# Patient Record
Sex: Male | Born: 1950
Health system: Southern US, Community
[De-identification: ages and names within clinical notes are randomized; demographics above are authoritative.]

## PROBLEM LIST (undated history)

## (undated) DIAGNOSIS — R112 Nausea with vomiting, unspecified: Secondary | ICD-10-CM

## (undated) DIAGNOSIS — F419 Anxiety disorder, unspecified: Secondary | ICD-10-CM

## (undated) DIAGNOSIS — G8929 Other chronic pain: Secondary | ICD-10-CM

## (undated) DIAGNOSIS — C801 Malignant (primary) neoplasm, unspecified: Secondary | ICD-10-CM

## (undated) DIAGNOSIS — M542 Cervicalgia: Secondary | ICD-10-CM

## (undated) DIAGNOSIS — Z87898 Personal history of other specified conditions: Secondary | ICD-10-CM

## (undated) DIAGNOSIS — Z9889 Other specified postprocedural states: Secondary | ICD-10-CM

## (undated) DIAGNOSIS — K219 Gastro-esophageal reflux disease without esophagitis: Secondary | ICD-10-CM

## (undated) DIAGNOSIS — R002 Palpitations: Secondary | ICD-10-CM

## (undated) DIAGNOSIS — M1990 Unspecified osteoarthritis, unspecified site: Secondary | ICD-10-CM

## (undated) DIAGNOSIS — I10 Essential (primary) hypertension: Secondary | ICD-10-CM

## (undated) DIAGNOSIS — R0981 Nasal congestion: Secondary | ICD-10-CM

## (undated) DIAGNOSIS — N400 Enlarged prostate without lower urinary tract symptoms: Secondary | ICD-10-CM

## (undated) DIAGNOSIS — G479 Sleep disorder, unspecified: Secondary | ICD-10-CM

## (undated) HISTORY — DX: Essential (primary) hypertension: I10

## (undated) HISTORY — PX: CERVICAL FUSION: SHX112

## (undated) HISTORY — PX: TONSILLECTOMY: SUR1361

## (undated) HISTORY — DX: Malignant (primary) neoplasm, unspecified: C80.1

## (undated) HISTORY — PX: HERNIA REPAIR: SHX51

## (undated) HISTORY — DX: Unspecified osteoarthritis, unspecified site: M19.90

## (undated) HISTORY — DX: Nasal congestion: R09.81

## (undated) HISTORY — DX: Other chronic pain: G89.29

## (undated) HISTORY — DX: Cervicalgia: M54.2

---

## 1996-06-06 HISTORY — PX: SHOULDER SURGERY: SHX246

## 2006-11-13 ENCOUNTER — Ambulatory Visit: Payer: Self-pay | Admitting: Orthopedic Surgery

## 2006-11-27 ENCOUNTER — Ambulatory Visit: Payer: Self-pay | Admitting: Orthopedic Surgery

## 2007-01-04 ENCOUNTER — Ambulatory Visit: Payer: Self-pay | Admitting: Orthopedic Surgery

## 2007-01-08 ENCOUNTER — Ambulatory Visit (HOSPITAL_COMMUNITY): Admission: RE | Admit: 2007-01-08 | Discharge: 2007-01-08 | Payer: Self-pay | Admitting: Orthopedic Surgery

## 2007-01-18 ENCOUNTER — Ambulatory Visit: Payer: Self-pay | Admitting: Orthopedic Surgery

## 2007-01-25 ENCOUNTER — Encounter (HOSPITAL_COMMUNITY): Admission: RE | Admit: 2007-01-25 | Discharge: 2007-02-24 | Payer: Self-pay | Admitting: Orthopedic Surgery

## 2007-02-22 ENCOUNTER — Encounter (INDEPENDENT_AMBULATORY_CARE_PROVIDER_SITE_OTHER): Payer: Self-pay | Admitting: *Deleted

## 2007-02-22 ENCOUNTER — Ambulatory Visit: Payer: Self-pay | Admitting: Orthopedic Surgery

## 2007-02-22 DIAGNOSIS — IMO0002 Reserved for concepts with insufficient information to code with codable children: Secondary | ICD-10-CM | POA: Insufficient documentation

## 2007-02-22 DIAGNOSIS — M1710 Unilateral primary osteoarthritis, unspecified knee: Secondary | ICD-10-CM

## 2007-03-06 ENCOUNTER — Ambulatory Visit (HOSPITAL_COMMUNITY): Admission: RE | Admit: 2007-03-06 | Discharge: 2007-03-06 | Payer: Self-pay | Admitting: Orthopedic Surgery

## 2007-03-06 ENCOUNTER — Ambulatory Visit: Payer: Self-pay | Admitting: Orthopedic Surgery

## 2007-03-08 ENCOUNTER — Ambulatory Visit: Payer: Self-pay | Admitting: Orthopedic Surgery

## 2007-03-09 ENCOUNTER — Encounter (HOSPITAL_COMMUNITY): Admission: RE | Admit: 2007-03-09 | Discharge: 2007-04-08 | Payer: Self-pay | Admitting: Orthopedic Surgery

## 2007-03-29 ENCOUNTER — Ambulatory Visit: Payer: Self-pay | Admitting: Orthopedic Surgery

## 2007-05-28 ENCOUNTER — Ambulatory Visit: Payer: Self-pay | Admitting: Orthopedic Surgery

## 2007-06-07 HISTORY — PX: KNEE SURGERY: SHX244

## 2007-06-20 ENCOUNTER — Ambulatory Visit (HOSPITAL_COMMUNITY): Admission: RE | Admit: 2007-06-20 | Discharge: 2007-06-20 | Payer: Self-pay

## 2007-12-05 ENCOUNTER — Ambulatory Visit: Payer: Self-pay | Admitting: Orthopedic Surgery

## 2007-12-05 DIAGNOSIS — M25819 Other specified joint disorders, unspecified shoulder: Secondary | ICD-10-CM | POA: Insufficient documentation

## 2007-12-05 DIAGNOSIS — M25519 Pain in unspecified shoulder: Secondary | ICD-10-CM | POA: Insufficient documentation

## 2007-12-05 DIAGNOSIS — M7580 Other shoulder lesions, unspecified shoulder: Secondary | ICD-10-CM

## 2007-12-21 ENCOUNTER — Emergency Department (HOSPITAL_COMMUNITY): Admission: EM | Admit: 2007-12-21 | Discharge: 2007-12-21 | Payer: Self-pay | Admitting: Emergency Medicine

## 2008-02-13 ENCOUNTER — Ambulatory Visit: Payer: Self-pay | Admitting: Orthopedic Surgery

## 2008-06-18 ENCOUNTER — Ambulatory Visit (HOSPITAL_COMMUNITY): Admission: RE | Admit: 2008-06-18 | Discharge: 2008-06-20 | Payer: Self-pay | Admitting: Neurosurgery

## 2008-12-30 ENCOUNTER — Ambulatory Visit: Payer: Self-pay | Admitting: Orthopedic Surgery

## 2009-01-22 ENCOUNTER — Ambulatory Visit: Payer: Self-pay | Admitting: Cardiology

## 2009-01-22 ENCOUNTER — Inpatient Hospital Stay (HOSPITAL_COMMUNITY): Admission: EM | Admit: 2009-01-22 | Discharge: 2009-01-22 | Payer: Self-pay | Admitting: Emergency Medicine

## 2009-02-24 ENCOUNTER — Ambulatory Visit (HOSPITAL_COMMUNITY): Admission: RE | Admit: 2009-02-24 | Discharge: 2009-02-24 | Payer: Self-pay | Admitting: Neurosurgery

## 2009-04-06 ENCOUNTER — Telehealth: Payer: Self-pay | Admitting: Orthopedic Surgery

## 2010-06-27 ENCOUNTER — Encounter: Payer: Self-pay | Admitting: Orthopedic Surgery

## 2010-07-06 NOTE — Assessment & Plan Note (Signed)
Summary: rt knee pain/no recent xr/evercare/bsf   History of Present Illness: I saw [name] in the office today for a followup visit.  He is a 60 years old man with the complaint of:  NEW PROBLEM. RIGHT KNEE PAIN.  XRAYS TODAY.  Patient had Arthroscopy on this knee on 03-06-07.  Patient states he is having some catching in his knee and moderate medial pain, without swelling   Dr Lovell Sheehan just fused C spine    Current Medications (verified): 1)  Opana 2)  Dicaphen 3)  Remron 4)  Paxil 5)  Celebrex 6)  Lortab 5 5-500 Mg  Tabs (Hydrocodone-Acetaminophen) .Marland Kitchen.. 1 By Mouth Q 6 As Needed  Allergies (verified): No Known Drug Allergies  Past History:  Past medical, surgical, family and social histories (including risk factors) reviewed, and no changes noted (except as noted below).  Past Medical History: Reviewed history from 01/03/2007 and no changes required. CARPAL TUNNEL THERASTON OUTLET SYNDROME DEPRESSION  Past Surgical History: Reviewed history from 01/03/2007 and no changes required. 3 CERVICALL SURGERIES DR, DAVIS IN [place] DID CERVICAL SURGERIES C5-6 - METAL PLATE  Family History: Reviewed history and no changes required.  Social History: Reviewed history and no changes required.  Physical Exam  Additional Exam:   The patient is well developed and nourished, with normal grooming and hygiene. The body habitus is   The pulses and perfusion were normal with normal color, temperature  and no swelling  The coordination and sensation were normal  The reflexes were normal   The skin was normal  Lymph nodes negative  Patient was awake alert and oriented x3  pain in the RIGHT knee with tenderness over the medial compartment.  Flexion arc is 120.  Knee is stable.  Most all normal.  Neurovascular exam normal.  He does have a mild limp.  Lymph nodes negative.  Sensation normal.   Impression & Recommendations:  Problem # 1:  TEAR MEDIAL MENISCUS  (ICD-836.0)  inject RIGHT knee  Sterile technique 40 mg Depo-Medrol and lidocaine 4 cc injected RIGHT knee no complications  Radiographs show progressive arthritis medial compartment.  Recommend followup 3 months reassess and a possible need for total knee  Orders: Joint Aspirate / Injection, Large (20610)  Problem # 2:  DEGENERATIVE JOINT DISEASE, KNEE (ICD-715.96)  His updated medication list for this problem includes:    Lortab 5 5-500 Mg Tabs (Hydrocodone-acetaminophen) .Marland Kitchen... 1 by mouth q 6 as needed  Orders: Joint Aspirate / Injection, Large (20610) Knee x-ray,  3 views (45409) Est. Patient Level IV (81191) Depo- Medrol 40mg  (J1030)  Patient Instructions: 1)  You have received an injection of cortisone today. You may experience increased pain at the injection site. Apply ice pack to the area for 20 minutes every 2 hours and take 2 xtra strength tylenol every 8 hours. This increased pain will usually resolve in 24 hours. The injection will take effect in 3-10 days.  2)  3 months come back

## 2010-07-06 NOTE — Assessment & Plan Note (Signed)
Summary: 3 WK RECHECK KNEE/POST OP 2/CAF    History of Present Illness: I saw Kavish Hlavacek back in the office today.  He is now s/p SARK medial menisectomy had moderate OA as well climbed a ladder knee began to swell and hurt   Current Allergies: No known allergies        Knee Exam  Knee Exam:    Right:    Inspection:  Abnormal    Palpation:  Normal    Stability:  stable    Tenderness:  no    Swelling:  diffuse    Range of Motion:       Flexion-Passive: 110 degrees       Extension-Passive: full    Impression & Recommendations:  Problem # 1:  DEGENERATIVE JOINT DISEASE, KNEE (ICD-715.96) Assessment: Improved  Orders: Post-Op Check (24401)   Problem # 2:  TEAR MEDIAL MENISCUS (ICD-836.0) Assessment: Improved  Orders: Post-Op Check (02725)    Patient Instructions: 1)  Please schedule a follow-up appointment in 2 months. 2)  check knee  3)  do exercises 2 x a day     ]

## 2010-09-11 LAB — BASIC METABOLIC PANEL
BUN: 11 mg/dL (ref 6–23)
CO2: 30 mEq/L (ref 19–32)
Calcium: 9.5 mg/dL (ref 8.4–10.5)
Chloride: 102 mEq/L (ref 96–112)
Creatinine, Ser: 0.95 mg/dL (ref 0.4–1.5)
GFR calc non Af Amer: >60 mL/min (ref >60)
Glucose, Bld: 110 mg/dL — ABNORMAL HIGH (ref 70–99)
Potassium: 3.7 mEq/L (ref 3.5–5.1)
Sodium: 140 mEq/L (ref 135–145)

## 2010-09-11 LAB — CBC
HCT: 39.7 % (ref 39.0–52.0)
Hemoglobin: 13.7 g/dL (ref 13.0–17.0)
MCHC: 34.4 g/dL (ref 30.0–36.0)
MCV: 87.9 fL (ref 78.0–100.0)
Platelets: 191 10*3/uL (ref 150–400)
RBC: 4.52 MIL/uL (ref 4.22–5.81)
RDW: 12.7 % (ref 11.5–15.5)
WBC: 7.9 10*3/uL (ref 4.0–10.5)

## 2010-09-11 LAB — DIFFERENTIAL
Basophils Absolute: 0.1 10*3/uL (ref 0.0–0.1)
Basophils Relative: 1 % (ref 0–1)
Eosinophils Absolute: 0.1 10*3/uL (ref 0.0–0.7)
Eosinophils Relative: 2 % (ref 0–5)
Lymphocytes Relative: 32 % (ref 12–46)
Lymphs Abs: 2.5 10*3/uL (ref 0.7–4.0)
Monocytes Absolute: 0.6 10*3/uL (ref 0.1–1.0)
Monocytes Relative: 8 % (ref 3–12)
Neutro Abs: 4.5 10*3/uL (ref 1.7–7.7)
Neutrophils Relative %: 57 % (ref 43–77)

## 2010-09-11 LAB — POCT CARDIAC MARKERS
CKMB, poc: <1 ng/mL — ABNORMAL LOW (ref 1.0–8.0)
CKMB, poc: <1 ng/mL — ABNORMAL LOW (ref 1.0–8.0)
Myoglobin, poc: 70 ng/mL (ref 12–200)
Myoglobin, poc: 73.2 ng/mL (ref 12–200)
Troponin i, poc: <0.05 ng/mL (ref 0.00–0.09)
Troponin i, poc: <0.05 ng/mL (ref 0.00–0.09)

## 2010-09-11 LAB — TSH: TSH: 1.843 u[IU]/mL (ref 0.350–4.500)

## 2010-09-11 LAB — CARDIAC PANEL(CRET KIN+CKTOT+MB+TROPI)
CK, MB: 1.4 ng/mL (ref 0.3–4.0)
Total CK: 71 U/L (ref 7–232)

## 2010-09-11 LAB — D-DIMER, QUANTITATIVE

## 2010-09-20 LAB — CBC
HCT: 42.7 % (ref 39.0–52.0)
Hemoglobin: 13.9 g/dL (ref 13.0–17.0)
MCHC: 32.6 g/dL (ref 30.0–36.0)
MCV: 87.1 fL (ref 78.0–100.0)
Platelets: 197 10*3/uL (ref 150–400)
RBC: 4.9 MIL/uL (ref 4.22–5.81)
RDW: 13.2 % (ref 11.5–15.5)
WBC: 7.1 10*3/uL (ref 4.0–10.5)

## 2010-10-19 NOTE — Op Note (Signed)
[name], [name]               ACCOUNT NO.:  [id number]   MEDICAL RECORD NO.:  [id number]          PATIENT TYPE:  AMB   LOCATION:  DAY                           FACILITY:  [facility]   PHYSICIAN:  Vickki Hearing, M.D.DATE OF BIRTH:  1950/06/22   DATE OF PROCEDURE:  03/06/2007  DATE OF DISCHARGE:                               OPERATIVE REPORT   HISTORY:  This is a 60 year old male who was followed for several months  with constant medial right knee pain.  He was treated with a brace, oral  medication, anti-inflammatories, analgesics, activity modification and  injection; he did not improve.  He eventually had an MRI which showed a  torn medial meniscus and moderate osteoarthritis.   PREOPERATIVE DIAGNOSES:  1. Torn medial meniscus, right knee.  2. Osteoarthritis.   POSTOPERATIVE DIAGNOSES:  1. Torn medial meniscus, right knee.  2. Osteoarthritis.   PROCEDURE:  1. Arthroscopy of the right knee.  2. Partial medial meniscectomy.  3. Joint debridement.  4. Chondroplasties of the patella and medial femoral condyle.  5. There was a spur which was excised from the medial tibial spine.   SURGEON:  Vickki Hearing, M.D.   ASSISTANTS:  There were no assistant.   ANESTHETIC:  General with LMA.   FINDINGS:  The posterior horn of the medial meniscus was torn.  It was a  complex degenerative tear.  There was grade 2 chondromalacia of the  medial femoral condyle.  There was a spur at the inferior pole of the  patella, at the medial tibial spine.  There were grade 3 changes of the  trochlea, grade 2 changes of patella.  There was moderate joint  sinusitis and the medial femoral condyle had grade 2 changes.   DESCRIPTION OF PROCEDURE:  The patient was properly identified in the  preop area.  The right knee was marked for surgery by the patient and  the surgeon.  The patient was given vancomycin due to penicillin  allergy; he was only able to tolerate half of vancomycin; he seemed  to  get the red man syndrome.   This vancomycin was stopped and he was taken to surgery and given some  Benadryl intraoperatively, which controlled this well.  He was given the  anesthetic with LMA technique.  His right knee was prepped and drape  with sterile technique using DuraPrep.   After a proper time-out, we proceeded by injecting the portal sites with  dilute epinephrine solution with Sensorcaine and injected the joint with  10 mL of same.  We did a lateral incision, established a lateral portal  and performed a diagnostic arthroscopy.  We established a medial portal  and repeated the diagnostic arthroscopy using a probe to palpate the  intra-articular structures.   We used a straight duckbill forceps to resect the torn meniscus and  balanced it with an ArthroCare wand and straight motorized shaver,  removing meniscal fragments.  We did a joint debridement using the same  instruments.   We did a chondroplasty of the medial femoral condyle, trochlea and  patella.  We used a bur  to resect an impinging medial tibial osteophyte  which was blocking extension.   We then irrigated the knee, closed the portals with Steri-Strips and we  then injected Sensorcaine 20 mL with dilute epinephrine solution added.   We then placed a sterile dressing with the Cryo/Cuff.  He was extubated  and taken to the recovery room in stable condition.   He has full weightbearing protocol therapy and follow up on Thursday.      Vickki Hearing, M.D.  Electronically Signed     SEH/MEDQ  D:  03/06/2007  T:  03/06/2007  Job:  [id number]

## 2010-10-19 NOTE — Consult Note (Signed)
[name], [name]               ACCOUNT NO.:  [id number]   MEDICAL RECORD NO.:  [id number]          PATIENT TYPE:  INP   LOCATION:  A306                          FACILITY:  [facility]   PHYSICIAN:  Gerrit Friends. Dietrich Pates, MD, FACCDATE OF BIRTH:  July 13, 1950   DATE OF CONSULTATION:  01/22/2009  DATE OF DISCHARGE:                                 CONSULTATION   REFERRING PHYSICIAN:  Angus G. McInnis, MD   HISTORY OF PRESENT ILLNESS:  A 60 year old gentleman with no prior  history of cardiac or vascular disease, now referred for evaluation of  palpitations and chest tightness.  [name] was previously evaluated  by a cardiologist for palpitations.  He underwent event recording that  apparently was unremarkable.  A stress test was planned, but never  carried out.  He subsequently has done well until the past few days.  He  first developed mild nausea without emesis.  This was followed by 24  hours of intermittent diaphoresis and chills with no rigors.  He did not  take his temperature.  He had generalized malaise and fatigue, but no  localizing symptoms.  He subsequently developed some progressive chest  tightness of moderate severity without radiation and with an associated  sense of rapid regular heart rhythm with increased intensity.  This  prompted him to come to the emergency department where EKG and initial  cardiac markers were unremarkable.  Chest x-ray was normal except for an  elevated right hemidiaphragm.  He has subsequently been watched on  telemetry with no arrhythmias noted.  His symptoms have subsided.  No  fever has been documented.  His basic laboratory is negative including a  CBC and metabolic profile.   PAST MEDICAL HISTORY:  Generally benign.  He has undergone cervical disk  surgery on four different occasions at two levels, most recently in  January 2010.  He underwent laparoscopic right knee surgery for  cartilage repair in 2008.  He remotely underwent tonsillectomy.   He has  no chronic medical conditions.  He takes no medications chronically  except for diazepam and Opana.  He is followed by Dr. Vear Clock in pain  clinic for chronic neck and back symptoms.  Right shoulder arthroscopic  surgery was performed in 1997.   ALLERGIES:  An allergy to PENICILLIN is reported.   SOCIAL HISTORY:  Unmarried; no use of tobacco products or alcohol.   REVIEW OF SYSTEMS:  Notable for intermittent headaches characterized as  migraines, a history of lower extremity weakness - unclear whether this  represents neurologic impairment related to his longstanding cervical  spine disease.  A dermatologic neoplasm was resected in 1991, apparently  not melanoma.  He recently underwent liquid nitrogen treatment for  dysplastic lesions on both arms.  He eats a regular diet.  He had an  infectious hepatitis in 1976 without sequelae.  He intermittently notes  constipation.  He requires corrective lenses for near and far vision.  He reports chronic anxiety and depression.  He saw psychologist for  number of years in the past.  More recently, he has become increasingly  symptomatic despite  the use of Valium.  He has insomnia.  All other  systems reviewed and are negative.   PHYSICAL EXAMINATION:  GENERAL:  A pleasant gentleman in no acute  distress.  VITAL SIGNS:  The temperature is 97, heart rate 60 and regular,  respirations 20, and blood pressure 110/65.  Weight 99 kg, height 72  inches.  HEENT:  Anicteric sclerae; normal lids and conjunctivae; normal oral  mucosa.  NECK:  Multiple surgical scars; no jugular venous distention; normal  carotid upstrokes without bruits.  ENDOCRINE:  No thyromegaly.  HEMATOPOIETIC:  No adenopathy.  SKIN:  Multiple nummular areas particularly over the right forearm,  where skin was recently treated.  just at that up.  CARDIAC:  Normal first and second heart sounds; no murmurs or gallops  appreciated.  LUNGS:  Clear.  ABDOMEN:  Soft and  nontender; normal bowel sounds; no organomegaly.  EXTREMITIES:  Trace edema; normal distal pulses.  NEUROLOGIC:  Symmetric strength and tone; normal cranial nerves.  PSYCHIATRIC:  Alert and oriented; normal affect; tearful when discussing  recent emotional problems.   EKG:  Normal sinus rhythm; J-point elevation in the inferior leads.   CHEST X-RAY:  Cardiomegaly; elevated right hemidiaphragm.   IMPRESSION:  [name] presents with a multi-day illness that sounds  like an incipient infection, perhaps a viral infection.  His symptoms  appeared to be subsiding.  His chest discomfort was nonspecific.  His  risk for coronary artery disease is low.  His initial evaluation  including EKG and cardiac markers is reassuring.  We will obtain a  second CPK-MB and troponin as well as a D-dimer level.  A stress test  will be performed to exclude high-risk coronary artery disease.  If  negative, there appears to be no impediment to discharge later today.      Gerrit Friends. Dietrich Pates, MD, [facility]  Electronically Signed     RMR/MEDQ  D:  01/22/2009  T:  01/23/2009  Job:  [id number]

## 2010-10-19 NOTE — H&P (Signed)
NAMEBRETTON, [name]               ACCOUNT NO.:  [id number]   MEDICAL RECORD NO.:  [id number]          PATIENT TYPE:  INP   LOCATION:  A306                          FACILITY:  [facility]   PHYSICIAN:  Angus G. Renard Matter, MD   DATE OF BIRTH:  1950-08-15   DATE OF ADMISSION:  01/21/2009  DATE OF DISCHARGE:  LH                              HISTORY & PHYSICAL   This patient is 60 year old, developed palpitations, was seen in the  emergency department with these symptoms, does have associated anxiety,  depression, diaphoresis, and dyspnea.  He does have chronic pain and is  on chronic pain medications through Dr. Vear Clock' clinic in [place],  [facility].  He did have surgery by Dr. Lovell Sheehan, neurosurgeon in  [place] on his neck in January.   SOCIAL HISTORY:  The patient does not smoke or drink alcohol or use  drugs.  Former cigarette smoker.   SURGICAL HISTORY:  The patient has history of cervical disk surgery in  January, prior knee surgery, tonsillectomy.   ALLERGIES:  PENICILLIN.   MEDICATION:  Diazepam and Opana.   REVIEW OF SYSTEMS:  HEENT:  Negative.  CARDIOPULMONARY:  No cough,  hemoptysis, or dyspnea, but history of palpitations, recent onset.  GI:  No bowel irregularity or bleeding.  GU:  No dysuria, hematuria.   PHYSICAL EXAMINATION:  GENERAL:  Alert male.  VITAL SIGNS:  Blood pressure 110/64, respirations 20, pulse 59,  temperature 97.0.  HEENT:  Eyes: PERRLA.  TMs negative.  NECK:  Supple.  No JVD or thyroid abnormalities.  HEART:  Regular rhythm.  No murmurs.  LUNGS:  Clear to P&A.  ABDOMEN:  No palpable organs or masses.  No organomegaly.  EXTREMITIES:  Free of edema.  NEUROLOGIC:  No focal deficits.   ASSESSMENT:  The patient does have chronic pain in his neck from  previous disk surgery.  He developed palpitations, irregular heart beat  of undetermined etiology, premature atrial contractions.   PLAN:  To obtain additional electrocardiogram and Cardiology  consult  today.  Continue with chronic pain meds.      Angus G. Renard Matter, MD  Electronically Signed     AGM/MEDQ  D:  01/22/2009  T:  01/22/2009  Job:  [id number]

## 2010-10-19 NOTE — Op Note (Signed)
NAMENANDAN, WILLEMS NO.:  [id number]   MEDICAL RECORD NO.:  [id number]          PATIENT TYPE:  OIB   LOCATION:  3526                         FACILITY:  [facility]   PHYSICIAN:  Cristi Loron, M.D.DATE OF BIRTH:  [birth year]   DATE OF PROCEDURE:  06/18/2008  DATE OF DISCHARGE:                               OPERATIVE REPORT   BRIEF HISTORY:  The patient is a 60 year old white male who suffered  from neck and shoulder and arm pain.  He failed medical management.  The  patient has had prior C5-6 anterior cervical fusion and plating.  The  patient has failed medical management, worked up with a cervical MRI  which demonstrated he had spondylosis and stenosis at C4-5.  His  symptoms seemed consistent with C5 radiculopathy.  I have discussed  various treatment with the patient including surgery.  He has weighed  the risks, benefits, and alternatives of surgery and decided to proceed  with exploration of C5-6 fusion and a C4-5 anterior cervical diskectomy  and fusion and plating.   PREOPERATIVE DIAGNOSES:  C4-5 spondylosis, stenosis, cervical  radiculopathy, and cervicalgia.   POSTOPERATIVE DIAGNOSIS:  C4-5 spondylosis, stenosis, cervical  radiculopathy, and cervicalgia.   PROCEDURE:  Exploration of C5-6 fusion with removal of old Cephalon  plate; Z6-1 extensive anterior cervical diskectomy/decompression; C4-5  anterior interbody arthrodesis with local morselized autograft bone and  Actifuse bone graft extender; insertion of C4-5 interbody prosthesis  (Novel PEEK interbody prosthesis); C4-5 anterior cervical plating with  Codman Slim-Loc  titanium plate and screws.   SURGEON:  Cristi Loron, MD   ASSISTANT:  Coletta Memos, MD   ANESTHESIA:  General endotracheal.   ESTIMATED BLOOD LOSS:  50 mL.   SPECIMENS:  None.   DRAINS:  None.   COMPLICATIONS:  None.   DESCRIPTION OF PROCEDURE:  The patient was brought to the operating room  by Anesthesia team.   General endotracheal anesthesia was induced.  The  patient remained in supine position.  A roll was placed under his  shoulders placing neck in slight extension.  His anterior cervical  region was then prepared with Betadine scrub and Betadine solution.  Sterile drapes were applied.  I then injected the area to be incised  with Marcaine with epinephrine solution.  I used a scalpel to make a  transverse incision in the patient's left anterior neck.  I used a  Metzenbaum scissors to divide the platysma muscle and then to dissect  medial to sternocleidomastoid muscle, jugular vein, and carotid artery.  We carefully dissected down towards the anterior cervical spine  encountering the scar tissue from the previous operations.  We carefully  identified the esophagus, retracted it medially, and then used to Kitner  swabs to approach soft tissue from anterior cervical spine and expose  the old plate.  We then exposed the screws from the old plate and then  used screwdriver to remove screws and removed the old plate.  We  inspected the fusion at C5-6, it appear to be solid.   Having completed exploration of the arthrodesis, we now turned our  attention to the decompression of C4-5.  We obtained intraoperative  radiograph to confirm our location.  We then incised C4-5 intervertebral  disk with 15-blade scalpel and performed partial intervertebral  diskectomy using pituitary forceps and Carlen curettes.  We then  inserted distraction screws at C4 and C5, distracted interspace, and  then we brought the operative microscope into field and under its  magnification and illumination, we completed the  microdissection/decompression.  We decorticated the vertebral endplates  at C4-5 with a high-speed drill.  We drilled away the remainder of C4-5  intervertebral disk.  We drilled away some posterior spondylosis and  then thinned out the posterior longitudinal ligament and incised the  ligament with an  arachnoid knife and removed with Kerrison punch,  undercutting the vertebral endplates and decompressing the thecal sac.  We then performed foraminotomies about the bilateral C5 nerve root  completing the decompression.   We now turned our attention to arthrodesis.  We used trial spacer and  determined to use a 7-mm medium Nobel PEEK interbody prosthesis.  We  prefilled this prosthesis with combination of local autograft bone that  we obtained during the decompression as well as Actifuse bone graft  extender.  We inserted the prosthesis into the distracted interspace and  then removed distraction screws.  There was good snug fit of the  prosthesis.   We now turned our attention to anterior spinal instrumentation.  We used  the old holes from the old plate at C5.  We did drill 2 new 14-mm holes  to C4 after selecting appropriate length anterior cervical plate.  We  secured the plate to vertebral bodies by placing two 14-mm self-tapping  screws through the old holes at C5 and 2 through the new holes at C4.  The plate was in a bit crooked because the old plate was a bit crooked  but rather than chance stripping the screws, we felt it best to leave it  a bit crooked.  We got good bony purchase of screws.  We then obtained  intraoperative radiograph demonstrating good position of plate, screws,  and bioprosthesis.  We therefore secured the screws to plate by locking  each cam.  This completed the instrumentation.   We now turned our attention to obtaining stringent hemostasis with  bipolar electrocautery.  We irrigated the wound out with bacitracin  solution.  We then removed the retractors and inspected esophagus for  any damage, none was apparent.  We then reapproximated the patient's  platysma muscle with interrupted 3-0 Vicryl suture, subcutaneous tissue  with interrupted 3-0 Vicryl suture, and skin with Steri-Strips and  Benzoin.  The wound was then coated with bacitracin ointment.  A  sterile  dressing was applied.  The drapes were removed and the patient was  subsequently extubated by Anesthesia team and transported to the Post  Anesthesia Care Unit in stable condition.  All sponge, instrument, and  needle counts were correct at the end of the case.      Cristi Loron, M.D.  Electronically Signed     JDJ/MEDQ  D:  06/18/2008  T:  06/19/2008  Job:  [id number]

## 2010-10-22 NOTE — Procedures (Signed)
[name], [name]               ACCOUNT NO.:  [id number]   MEDICAL RECORD NO.:  [id number]          PATIENT TYPE:  INP   LOCATION:  A306                          FACILITY:  [facility]   PHYSICIAN:  Gerrit Friends. Dietrich Pates, MD, FACCDATE OF BIRTH:  June 30, 1950   DATE OF PROCEDURE:  01/26/2009  DATE OF DISCHARGE:  01/22/2009                                  STRESS TEST   REFERRING DOCTOR:  Angus G. McInnis, MD   CLINICAL DATA:  A 60 year old gentleman admitted with chest pain and  palpitations.  1. Treadmill exercise performed to a workload of 9 METS and a heart      rate of 189, 115% of age predicted maximum.  Exercise discontinued      due to dyspnea and fatigue; no significant chest pain noted.  2. Blood pressure increased from a resting value of 160/90 to 190/90      during exercise and 200/100 in recovery, a mildly hypertensive      response.  3. No arrhythmia is noted; there were occasional PVCs and a few paired      PVCs.  4. EKG:  Normal sinus rhythm; within normal limits; slight J-point      elevation consistent with early repolarization.  Stress EKG:  Insignificant upsloping ST-segment depression reverting to  normal immediately in recovery.   IMPRESSION:  Adequate and negative graded exercise test revealing  slightly impaired exercise tolerance, failure to reproduce the patient's  presenting symptoms, and no electrocardiographic evidence for myocardial  ischemia or infarction.      Gerrit Friends. Dietrich Pates, MD, [facility]  Electronically Signed     RMR/MEDQ  D:  01/26/2009  T:  01/26/2009  Job:  [id number]

## 2010-10-22 NOTE — Discharge Summary (Signed)
[name], [name]               ACCOUNT NO.:  [id number]   MEDICAL RECORD NO.:  [id number]          PATIENT TYPE:  INP   LOCATION:  A306                          FACILITY:  [facility]   PHYSICIAN:  Angus G. Renard Matter, MD   DATE OF BIRTH:  24-Nov-1950   DATE OF ADMISSION:  01/21/2009  DATE OF DISCHARGE:  08/19/2010LH                               DISCHARGE SUMMARY   A 60 year old white male admitted January 21, 2009, and discharged January 22, 2009, 1 day hospitalization.   DIAGNOSES:  Cardiac arrhythmia, palpitations, chronic neck pain from  previous disk surgery.   Condition is stable and improved at the time of discharge.  This 57-year-  old white male developed palpitations, seen in emergency department with  these symptoms, does have associated anxiety, depression, diaphoresis  and dyspnea, does have chronic pain, is on chronic pain medications with  Dr. [facility] in [facility].  He did have surgery by  Dr. Lovell Sheehan, neurosurgeon, in [place] on his neck in January.   PHYSICAL EXAMINATION:  GENERAL:  Alert male.  VITAL SIGNS:  Blood pressure 110/64, respiration 20, pulse 59,  temperature 97.  HEENT:  Eyes, PERRLA.  TMs negative.  Oropharynx benign.  NECK:  Supple.  No JVD or thyroid abnormalities.  HEART:  Regular rhythm.  No murmurs.  LUNGS:  Clear to P and A.  ABDOMEN:  No palpable organs or masses.  EXTREMITIES:  Free of edema.  NEUROLOGIC:  No focal deficits.   LABORATORY DATA:  EKG, normal sinus rhythm.  The patient had negative  cardiac markers, negative D-dimer.  Chemistries; sodium 140, potassium  3.7, chloride 102, CO2 30, glucose 110, BUN 11.  CBC; WBC 7900 with  hemoglobin 13.7 and hematocrit 39.7.   HOSPITAL COURSE:  The patient at the time of this admission was placed  on 2 g sodium low-carb diet, Valium 5 mg t.i.d., Opana 30 mg b.i.d.,  this was  changed to 10 mg every 6 hours initially then back to upon ER  30 mg 2 b.i.d.  He was seen by  Cardiology and had a stress test done.  He was placed on Lovenox 40 mg subcutaneously daily.  The patient  apparently had negative stress test, was relatively asymptomatic and was  able to be discharged.  After 1 day hospitalization, he was discharged  on Opana 30 mg 2 in the morning, 2 each p.m. and for breakthrough 1  every 6 hours.  Valium 5 mg every 6 hours.  The patient is stable at the  time of his discharge.     Angus G. Renard Matter, MD  Electronically Signed    AGM/MEDQ  D:  02/02/2009  T:  02/02/2009  Job:  [id number]

## 2010-12-03 ENCOUNTER — Other Ambulatory Visit (HOSPITAL_COMMUNITY): Payer: Self-pay | Admitting: Anesthesiology

## 2010-12-03 DIAGNOSIS — M542 Cervicalgia: Secondary | ICD-10-CM

## 2010-12-03 DIAGNOSIS — R52 Pain, unspecified: Secondary | ICD-10-CM

## 2010-12-07 ENCOUNTER — Ambulatory Visit (HOSPITAL_COMMUNITY)
Admission: RE | Admit: 2010-12-07 | Discharge: 2010-12-07 | Disposition: A | Discharge status: Home / Self Care | Payer: Worker's Compensation | Source: Ambulatory Visit | Attending: Anesthesiology | Admitting: Anesthesiology

## 2010-12-07 DIAGNOSIS — R52 Pain, unspecified: Secondary | ICD-10-CM

## 2010-12-07 DIAGNOSIS — M542 Cervicalgia: Secondary | ICD-10-CM | POA: Insufficient documentation

## 2010-12-07 DIAGNOSIS — M79609 Pain in unspecified limb: Secondary | ICD-10-CM | POA: Insufficient documentation

## 2010-12-07 MED ORDER — GADOBENATE DIMEGLUMINE 529 MG/ML IV SOLN
20.0000 mL | Freq: Once | INTRAVENOUS | Status: AC | PRN
  Administered 2010-12-07: 20 mL via INTRAVENOUS

## 2011-03-17 LAB — BASIC METABOLIC PANEL
BUN: 12
CO2: 32
Calcium: 9
Chloride: 101
Creatinine, Ser: 1.06
GFR calc non Af Amer: >60
Glucose, Bld: 120 — ABNORMAL HIGH
Potassium: 4.4
Sodium: 140

## 2011-03-17 LAB — HEMOGLOBIN AND HEMATOCRIT, BLOOD
HCT: 42.9
Hemoglobin: 14.5

## 2011-09-05 ENCOUNTER — Telehealth: Payer: Self-pay

## 2011-09-05 NOTE — Telephone Encounter (Signed)
Pt left VM that he would like to schedule colonoscopy with Dr. Jena Gauss. I returned the call. He is going to see Dr. Sudie Bailey today or tomorrow for his side. He will call back later. ( He said that Dr. Jena Gauss did his Dad and told him he should be having a colonoscopy sometime, he is 47 and never had one.  I asked about his meds. He is on pain meds and meds to help him sleep. I told him he will need OV prior to scheduliing.

## 2011-09-09 NOTE — Telephone Encounter (Signed)
LMOM to call.

## 2011-09-09 NOTE — Telephone Encounter (Signed)
Pt called and said that Dr. Sudie Bailey is referring him to a general surgeon for possible hernia. He wants to get this out of the way before he proceeds with colonoscopy. He will call when he is ready.

## 2011-10-17 ENCOUNTER — Ambulatory Visit (INDEPENDENT_AMBULATORY_CARE_PROVIDER_SITE_OTHER): Payer: Medicare Other | Admitting: General Surgery

## 2011-10-17 ENCOUNTER — Encounter (INDEPENDENT_AMBULATORY_CARE_PROVIDER_SITE_OTHER): Payer: Self-pay | Admitting: General Surgery

## 2011-10-17 VITALS — BP 146/92 | HR 78 | Temp 98.4°F | Resp 18 | Ht 70.0 in | Wt 190.5 lb

## 2011-10-17 DIAGNOSIS — K4090 Unilateral inguinal hernia, without obstruction or gangrene, not specified as recurrent: Secondary | ICD-10-CM

## 2011-10-17 NOTE — Patient Instructions (Signed)
[facility] ° °UMBILICAL OR INGUINAL HERNIA REPAIR: POST OP INSTRUCTIONS ° °Always review your discharge instruction sheet given to you by the facility where your surgery was performed. °IF YOU HAVE DISABILITY OR FAMILY LEAVE FORMS, YOU MUST BRING THEM TO THE OFFICE FOR PROCESSING.   °DO NOT GIVE THEM TO YOUR DOCTOR. ° °1. A  prescription for pain medication may be given to you upon discharge.  Take your pain medication as prescribed, if needed.  If narcotic pain medicine is not needed, then you may take acetaminophen (Tylenol), naprosyn (Alleve) or ibuprofen (Advil) as needed. °2. Take your usually prescribed medications unless otherwise directed. °3. If you need a refill on your pain medication, please contact your pharmacy.  They will contact our office to request authorization. Prescriptions will not be filled after 5 pm or on week-ends. °4. You should follow a light diet the first 24 hours after arrival home, such as soup and crackers, etc.  Be sure to include lots of fluids daily.  Resume your normal diet the day after surgery. °5. Most patients will experience some swelling and bruising around the umbilicus or in the groin and scrotum.  Ice packs and reclining will help.  Swelling and bruising can take several days to resolve.  °6. It is common to experience some constipation if taking pain medication after surgery.  Increasing fluid intake and taking a stool softener (such as Colace) will usually help or prevent this problem from occurring.  A mild laxative (Milk of Magnesia or Miralax) should be taken according to package directions if there are no bowel movements after 48 hours. °7. Unless discharge instructions indicate otherwise, you may remove your bandages 48 hours after surgery, and you may shower at that time.  You may have steri-strips (small skin tapes) in place directly over the incision.  These strips should be left on the skin for 7-10 days and will come off on their own.   If your surgeon used skin glue on the incision, you may shower in 24 hours.  The glue will flake off over the next 2-3 weeks.  Any sutures or staples will be removed at the office during your follow-up visit. °8. ACTIVITIES:  You may resume regular (light) daily activities beginning the next day--such as daily self-care, walking, climbing stairs--gradually increasing activities as tolerated.  You may have sexual intercourse when it is comfortable.  Refrain from any heavy lifting or straining until approved by your doctor. °a. You may drive when you are no longer taking prescription pain medication, you can comfortably wear a seatbelt, and you can safely maneuver your car and apply brakes. °b. RETURN TO WORK:  __________________________________________________________ °9. You should see your doctor in the office for a follow-up appointment approximately 2-3 weeks after your surgery.  Make sure that you call for this appointment within a day or two after you arrive home to insure a convenient appointment time. °10. OTHER INSTRUCTIONS:  __________________________________________________________________________________________________________________________________________________________________________________________  °WHEN TO CALL YOUR DOCTOR: °1. Fever over 101.0 °2. Inability to urinate °3. Nausea and/or vomiting °4. Extreme swelling or bruising °5. Continued bleeding from incision. °6. Increased pain, redness, or drainage from the incision ° °The clinic staff is available to answer your questions during regular business hours.  Please don’t hesitate to call and ask to speak to one of the nurses for clinical concerns.  If you have a medical emergency, go to the nearest emergency room or call 911.  A surgeon from [facility]   is always on call at the hospital ° ° °[address], [address], [place], [place]  [address] ? ° [address] [address], [place], [place]   [address] °[phone] ? [phone] ? FAX  [phone] °Web site: [email or web address] ° ° °

## 2011-10-17 NOTE — Progress Notes (Signed)
Patient ID: [name], male   DOB: 1951/02/18, 61 y.o.   MRN: [id number]  Chief Complaint  Patient presents with  . Inguinal Hernia    HPI [name] is a 61 y.o. male.  Self referral HPI This is a 61 year old male who presents with a right groin mass and swelling for the past couple of months. His bothers him more when he is sitting. That's fine when he lays down it goes away. He has no real change in his bowel movements. He has some constipation due to his medications that he is on. He was diagnosed with inguinal hernia by an outside surgeon and comes in to discuss this.  Past Medical History  Diagnosis Date  . Arthritis   . Cancer   . Inguinal hernia     right  . Constipation   . Nasal congestion     Past Surgical History  Procedure Date  . Shoulder surgery 1998    right - scope  . Knee surgery 2009    right - scope  . Cervical fusion 1996, 1998, 2010    C6-C7, C5-C6, C4-C5    History reviewed. No pertinent family history.  Social History History  Substance Use Topics  . Smoking status: Former Smoker    Quit date: 10/16/1993  . Smokeless tobacco: Not on file  . Alcohol Use: No    Allergies  Allergen Reactions  . Penicillins Hives    All over the body.    Current Outpatient Prescriptions  Medication Sig Dispense Refill  . alfuzosin (UROXATRAL) 10 MG 24 hr tablet Daily.      Marland Kitchen ALPRAZolam (XANAX) 0.5 MG tablet Take 0.5 mg by mouth at bedtime as needed.      Marland Kitchen NASONEX 50 MCG/ACT nasal spray as needed.      . NON FORMULARY Taking lactose, about a tablespoon a day. Per patient in liquid form. Has to take to help keep regular bm's due to pain medications taken.      Marland Kitchen oxymorphone (OPANA ER) 20 MG 12 hr tablet Take 20 mg by mouth every 12 (twelve) hours.      . pantoprazole (PROTONIX) 40 MG tablet Daily.      . traZODone (DESYREL) 50 MG tablet Daily.      Marland Kitchen zolpidem (AMBIEN) 10 MG tablet Daily.        Review of Systems Review of Systems    Constitutional: Negative for fever, chills and unexpected weight change.  HENT: Positive for congestion. Negative for hearing loss, sore throat, trouble swallowing and voice change.   Eyes: Negative for visual disturbance.  Respiratory: Negative for cough and wheezing.   Cardiovascular: Negative for chest pain, palpitations and leg swelling.  Gastrointestinal: Positive for constipation. Negative for nausea, vomiting, abdominal pain, diarrhea, blood in stool, abdominal distention, anal bleeding and rectal pain.  Genitourinary: Negative for hematuria and difficulty urinating.  Musculoskeletal: Negative for arthralgias.  Skin: Negative for rash and wound.  Neurological: Negative for seizures, syncope, weakness and headaches.  Hematological: Negative for adenopathy. Does not bruise/bleed easily.  Psychiatric/Behavioral: Negative for confusion.    Blood pressure 146/92, pulse 78, temperature 98.4 F (36.9 C), temperature source Temporal, resp. rate 18, height 5\' 10"  (1.778 m), weight 190 lb 8 oz (86.41 kg).  Physical Exam Physical Exam  Vitals reviewed. Constitutional: He appears well-developed and well-nourished.  Cardiovascular: Normal rate, regular rhythm and normal heart sounds.   Pulmonary/Chest: Effort normal and breath sounds normal. He has no wheezes. He has  no rales.  Abdominal: Soft. Bowel sounds are normal. There is no tenderness. A hernia is present. Hernia confirmed positive in the right inguinal area. Hernia confirmed negative in the left inguinal area.  Genitourinary: Penis normal.  Lymphadenopathy:       Right: No inguinal adenopathy present.       Left: No inguinal adenopathy present.     Assessment    RIH, symptomatic    Plan    We discussed observation versus repair.  We discussed both laparoscopic and open inguinal hernia repairs. I described the procedure in detail.  The patient was given educational material.  Goals should be achieved with surgery. We  discussed the usage of mesh and the rationale behind that. We went over the pathophysiology of inguinal hernia. We have elected to perform open inguinal hernia repair with mesh.  We discussed the risks including bleeding, infection, recurrence, postoperative pain and chronic groin pain, testicular injury, urinary retention, numbness in groin and around incision.        [name] 10/17/2011, 9:57 AM

## 2011-10-27 ENCOUNTER — Encounter (HOSPITAL_COMMUNITY): Payer: Self-pay | Admitting: Pharmacy Technician

## 2011-11-02 ENCOUNTER — Encounter (HOSPITAL_COMMUNITY): Payer: Self-pay

## 2011-11-02 ENCOUNTER — Encounter (HOSPITAL_COMMUNITY)
Admission: RE | Admit: 2011-11-02 | Discharge: 2011-11-02 | Disposition: A | Discharge status: Home / Self Care | Payer: Medicare Other | Source: Ambulatory Visit | Attending: General Surgery | Admitting: General Surgery

## 2011-11-02 HISTORY — DX: Other specified postprocedural states: R11.2

## 2011-11-02 HISTORY — DX: Sleep disorder, unspecified: G47.9

## 2011-11-02 HISTORY — DX: Anxiety disorder, unspecified: F41.9

## 2011-11-02 HISTORY — DX: Personal history of other specified conditions: Z87.898

## 2011-11-02 HISTORY — DX: Other specified postprocedural states: Z98.890

## 2011-11-02 HISTORY — DX: Gastro-esophageal reflux disease without esophagitis: K21.9

## 2011-11-02 HISTORY — DX: Benign prostatic hyperplasia without lower urinary tract symptoms: N40.0

## 2011-11-02 LAB — CBC
HCT: 43.7 % (ref 39.0–52.0)
Hemoglobin: 14.4 g/dL (ref 13.0–17.0)
MCH: 29 pg (ref 26.0–34.0)
MCHC: 33 g/dL (ref 30.0–36.0)
MCV: 87.9 fL (ref 78.0–100.0)
Platelets: 192 10*3/uL (ref 150–400)
RBC: 4.97 MIL/uL (ref 4.22–5.81)
RDW: 12.7 % (ref 11.5–15.5)
WBC: 7.2 10*3/uL (ref 4.0–10.5)

## 2011-11-02 LAB — BASIC METABOLIC PANEL
BUN: 11 mg/dL (ref 6–23)
CO2: 28 mEq/L (ref 19–32)
Calcium: 9.7 mg/dL (ref 8.4–10.5)
Chloride: 101 mEq/L (ref 96–112)
Creatinine, Ser: 0.96 mg/dL (ref 0.50–1.35)
GFR calc Af Amer: >90 mL/min (ref >90)
GFR calc non Af Amer: 89 mL/min — ABNORMAL LOW (ref >90)
Glucose, Bld: 80 mg/dL (ref 70–99)
Potassium: 4.3 mEq/L (ref 3.5–5.1)
Sodium: 138 mEq/L (ref 135–145)

## 2011-11-02 LAB — SURGICAL PCR SCREEN
MRSA, PCR: NEGATIVE
Staphylococcus aureus: NEGATIVE

## 2011-11-02 NOTE — Patient Instructions (Addendum)
20 [name]  11/02/2011   Your procedure is scheduled on: 11/08/11  Report to SHORT STAY DEPT  at 6:45 AM.  Call this number if you have problems the morning of surgery: [phone]   Remember:   Do not eat food or drink liquids AFTER MIDNIGHT    Take these medicines the morning of surgery with A SIP OF WATER: OPANA / XANAX IF NEEDED   Do not wear jewelry, make-up or nail polish.  Do not wear lotions, powders, or perfumes.   Do not shave legs or underarms 48 hrs. before surgery (men may shave face)  Do not bring valuables to the hospital.  Contacts, dentures or bridgework may not be worn into surgery.  Leave suitcase in the car. After surgery it may be brought to your room.  For patients admitted to the hospital, checkout time is 11:00 AM the day of discharge.   Patients discharged the day of surgery will not be allowed to drive home.    Special Instructions:   Please read over the following fact sheets that you were given: MRSA  Information               SHOWER WITH BETASEPT THE NIGHT BEFORE SURGERY AND THE MORNING OF SURGERY             DISCONTINUE ALL ASPIRIN AND HERBAL MED 7 DAYS PREOP

## 2011-11-07 ENCOUNTER — Encounter (INDEPENDENT_AMBULATORY_CARE_PROVIDER_SITE_OTHER): Payer: Medicare Other | Admitting: General Surgery

## 2011-11-08 ENCOUNTER — Ambulatory Visit (HOSPITAL_COMMUNITY)
Admission: RE | Admit: 2011-11-08 | Discharge: 2011-11-08 | Disposition: A | Discharge status: Home / Self Care | Payer: Medicare Other | Source: Ambulatory Visit | Attending: General Surgery | Admitting: General Surgery

## 2011-11-08 ENCOUNTER — Ambulatory Visit (HOSPITAL_COMMUNITY): Payer: Medicare Other | Admitting: Anesthesiology

## 2011-11-08 ENCOUNTER — Encounter (HOSPITAL_COMMUNITY): Payer: Self-pay | Admitting: Anesthesiology

## 2011-11-08 ENCOUNTER — Encounter (HOSPITAL_COMMUNITY): Payer: Self-pay | Admitting: *Deleted

## 2011-11-08 ENCOUNTER — Encounter (HOSPITAL_COMMUNITY)
Admission: RE | Disposition: A | Discharge status: Home / Self Care | Payer: Self-pay | Source: Ambulatory Visit | Attending: General Surgery

## 2011-11-08 DIAGNOSIS — K4090 Unilateral inguinal hernia, without obstruction or gangrene, not specified as recurrent: Secondary | ICD-10-CM

## 2011-11-08 DIAGNOSIS — J3489 Other specified disorders of nose and nasal sinuses: Secondary | ICD-10-CM | POA: Insufficient documentation

## 2011-11-08 DIAGNOSIS — Z79899 Other long term (current) drug therapy: Secondary | ICD-10-CM | POA: Insufficient documentation

## 2011-11-08 DIAGNOSIS — Z01812 Encounter for preprocedural laboratory examination: Secondary | ICD-10-CM | POA: Insufficient documentation

## 2011-11-08 DIAGNOSIS — K5900 Constipation, unspecified: Secondary | ICD-10-CM | POA: Insufficient documentation

## 2011-11-08 DIAGNOSIS — M129 Arthropathy, unspecified: Secondary | ICD-10-CM | POA: Insufficient documentation

## 2011-11-08 HISTORY — PX: INGUINAL HERNIA REPAIR: SHX194

## 2011-11-08 SURGERY — REPAIR, HERNIA, INGUINAL, ADULT
Anesthesia: General | Site: Groin | Laterality: Right | Wound class: Clean

## 2011-11-08 MED ORDER — ACETAMINOPHEN 325 MG PO TABS: 650.0000 mg | ORAL_TABLET | ORAL | Status: DC | PRN

## 2011-11-08 MED ORDER — ACETAMINOPHEN 10 MG/ML IV SOLN
INTRAVENOUS | Status: DC | PRN
  Administered 2011-11-08: 1000 mg via INTRAVENOUS

## 2011-11-08 MED ORDER — MORPHINE SULFATE 2 MG/ML IJ SOLN
INTRAMUSCULAR | Status: AC
  Administered 2011-11-08: 2 mg via INTRAVENOUS
  Filled 2011-11-08: qty 1

## 2011-11-08 MED ORDER — LACTATED RINGERS IV SOLN: INTRAVENOUS | Status: DC

## 2011-11-08 MED ORDER — HYDROMORPHONE HCL PF 1 MG/ML IJ SOLN
0.2500 mg | INTRAMUSCULAR | Status: DC | PRN
  Administered 2011-11-08 (×3): 0.5 mg via INTRAVENOUS

## 2011-11-08 MED ORDER — LACTATED RINGERS IV SOLN
INTRAVENOUS | Status: DC | PRN
  Administered 2011-11-08: 09:00:00 via INTRAVENOUS

## 2011-11-08 MED ORDER — HYDROMORPHONE HCL PF 1 MG/ML IJ SOLN
INTRAMUSCULAR | Status: AC
  Filled 2011-11-08: qty 1

## 2011-11-08 MED ORDER — ONDANSETRON HCL 4 MG/2ML IJ SOLN
INTRAMUSCULAR | Status: DC | PRN
  Administered 2011-11-08: 4 mg via INTRAVENOUS

## 2011-11-08 MED ORDER — ONDANSETRON HCL 4 MG/2ML IJ SOLN: 4.0000 mg | Freq: Four times a day (QID) | INTRAMUSCULAR | Status: DC | PRN

## 2011-11-08 MED ORDER — LIDOCAINE HCL (CARDIAC) 20 MG/ML IV SOLN
INTRAVENOUS | Status: DC | PRN
  Administered 2011-11-08: 75 mg via INTRAVENOUS

## 2011-11-08 MED ORDER — OXYCODONE HCL 5 MG PO TABS
ORAL_TABLET | ORAL | Status: AC
  Administered 2011-11-08: 10 mg via ORAL
  Filled 2011-11-08: qty 2

## 2011-11-08 MED ORDER — SODIUM CHLORIDE 0.9 % IJ SOLN: 3.0000 mL | Freq: Two times a day (BID) | INTRAMUSCULAR | Status: DC

## 2011-11-08 MED ORDER — SODIUM CHLORIDE 0.9 % IJ SOLN: 3.0000 mL | INTRAMUSCULAR | Status: DC | PRN

## 2011-11-08 MED ORDER — MORPHINE SULFATE 10 MG/ML IJ SOLN: 2.0000 mg | INTRAMUSCULAR | Status: DC | PRN

## 2011-11-08 MED ORDER — FENTANYL CITRATE 0.05 MG/ML IJ SOLN
INTRAMUSCULAR | Status: DC | PRN
  Administered 2011-11-08 (×2): 50 ug via INTRAVENOUS
  Administered 2011-11-08: 100 ug via INTRAVENOUS
  Administered 2011-11-08: 50 ug via INTRAVENOUS

## 2011-11-08 MED ORDER — SODIUM CHLORIDE 0.9 % IV SOLN
INTRAVENOUS | Status: DC
  Administered 2011-11-08: 12:00:00 via INTRAVENOUS

## 2011-11-08 MED ORDER — ACETAMINOPHEN 10 MG/ML IV SOLN
INTRAVENOUS | Status: AC
  Filled 2011-11-08: qty 100

## 2011-11-08 MED ORDER — 0.9 % SODIUM CHLORIDE (POUR BTL) OPTIME
TOPICAL | Status: DC | PRN
  Administered 2011-11-08: 1000 mL

## 2011-11-08 MED ORDER — VANCOMYCIN HCL IN DEXTROSE 1-5 GM/200ML-% IV SOLN
1000.0000 mg | INTRAVENOUS | Status: AC
  Administered 2011-11-08: 1000 mg via INTRAVENOUS

## 2011-11-08 MED ORDER — OXYCODONE HCL 5 MG PO TABS: 5.0000 mg | ORAL_TABLET | Freq: Four times a day (QID) | ORAL | Status: AC | PRN

## 2011-11-08 MED ORDER — DEXAMETHASONE SODIUM PHOSPHATE 10 MG/ML IJ SOLN
INTRAMUSCULAR | Status: DC | PRN
  Administered 2011-11-08: 10 mg via INTRAVENOUS

## 2011-11-08 MED ORDER — BUPIVACAINE HCL 0.25 % IJ SOLN
INTRAMUSCULAR | Status: AC
  Filled 2011-11-08: qty 1

## 2011-11-08 MED ORDER — BUPIVACAINE HCL 0.25 % IJ SOLN
INTRAMUSCULAR | Status: DC | PRN
  Administered 2011-11-08: 20 mL

## 2011-11-08 MED ORDER — OXYCODONE HCL 5 MG PO TABS
5.0000 mg | ORAL_TABLET | ORAL | Status: DC | PRN
  Administered 2011-11-08: 10 mg via ORAL

## 2011-11-08 MED ORDER — OXYCODONE HCL 5 MG PO TABS: 10.0000 mg | ORAL_TABLET | Freq: Four times a day (QID) | ORAL | Status: DC | PRN

## 2011-11-08 MED ORDER — SODIUM CHLORIDE 0.9 % IV SOLN: 250.0000 mL | INTRAVENOUS | Status: DC | PRN

## 2011-11-08 MED ORDER — PROMETHAZINE HCL 25 MG/ML IJ SOLN: 6.2500 mg | INTRAMUSCULAR | Status: DC | PRN

## 2011-11-08 MED ORDER — VANCOMYCIN HCL IN DEXTROSE 1-5 GM/200ML-% IV SOLN
INTRAVENOUS | Status: AC
  Filled 2011-11-08: qty 200

## 2011-11-08 MED ORDER — ACETAMINOPHEN 650 MG RE SUPP
650.0000 mg | RECTAL | Status: DC | PRN
  Filled 2011-11-08: qty 1

## 2011-11-08 MED ORDER — MIDAZOLAM HCL 5 MG/5ML IJ SOLN
INTRAMUSCULAR | Status: DC | PRN
  Administered 2011-11-08: 2 mg via INTRAVENOUS

## 2011-11-08 MED ORDER — PROPOFOL 10 MG/ML IV BOLUS
INTRAVENOUS | Status: DC | PRN
  Administered 2011-11-08: 200 mg via INTRAVENOUS

## 2011-11-08 MED ORDER — HYDROMORPHONE HCL PF 1 MG/ML IJ SOLN
INTRAMUSCULAR | Status: DC | PRN
  Administered 2011-11-08 (×2): 0.5 mg via INTRAVENOUS
  Administered 2011-11-08: 1 mg via INTRAVENOUS

## 2011-11-08 SURGICAL SUPPLY — 43 items
ADH SKN CLS APL DERMABOND .7 (GAUZE/BANDAGES/DRESSINGS) ×1
APL SKNCLS STERI-STRIP NONHPOA (GAUZE/BANDAGES/DRESSINGS)
BENZOIN TINCTURE PRP APPL 2/3 (GAUZE/BANDAGES/DRESSINGS) IMPLANT
BLADE HEX COATED 2.75 (ELECTRODE) ×2 IMPLANT
CANISTER SUCTION 2500CC (MISCELLANEOUS) ×2 IMPLANT
CHLORAPREP W/TINT 26ML (MISCELLANEOUS) ×1 IMPLANT
CLOTH BEACON ORANGE TIMEOUT ST (SAFETY) ×2 IMPLANT
DECANTER SPIKE VIAL GLASS SM (MISCELLANEOUS) ×2 IMPLANT
DERMABOND ADVANCED (GAUZE/BANDAGES/DRESSINGS) ×1
DERMABOND ADVANCED .7 DNX12 (GAUZE/BANDAGES/DRESSINGS) IMPLANT
DRAIN PENROSE 18X1/2 LTX STRL (DRAIN) ×1 IMPLANT
DRAPE LAPAROSCOPIC ABDOMINAL (DRAPES) ×2 IMPLANT
ELECT REM PT RETURN 9FT ADLT (ELECTROSURGICAL) ×2
ELECTRODE REM PT RTRN 9FT ADLT (ELECTROSURGICAL) ×1 IMPLANT
GAUZE SPONGE 4X4 16PLY XRAY LF (GAUZE/BANDAGES/DRESSINGS) ×1 IMPLANT
GLOVE BIO SURGEON STRL SZ7 (GLOVE) ×2 IMPLANT
GLOVE BIOGEL PI IND STRL 7.0 (GLOVE) IMPLANT
GLOVE BIOGEL PI IND STRL 7.5 (GLOVE) ×1 IMPLANT
GLOVE BIOGEL PI INDICATOR 7.0 (GLOVE)
GLOVE BIOGEL PI INDICATOR 7.5 (GLOVE) ×1
GOWN PREVENTION PLUS LG XLONG (DISPOSABLE) ×1 IMPLANT
GOWN PREVENTION PLUS XLARGE (GOWN DISPOSABLE) ×2 IMPLANT
GOWN STRL NON-REIN LRG LVL3 (GOWN DISPOSABLE) ×3 IMPLANT
GOWN STRL REIN XL XLG (GOWN DISPOSABLE) ×2 IMPLANT
KIT BASIN OR (CUSTOM PROCEDURE TRAY) ×2 IMPLANT
MESH HERNIA SYS ULTRAPRO LRG (Mesh General) ×1 IMPLANT
NDL HYPO 25X1 1.5 SAFETY (NEEDLE) ×1 IMPLANT
NEEDLE HYPO 25X1 1.5 SAFETY (NEEDLE) ×2 IMPLANT
NS IRRIG 1000ML POUR BTL (IV SOLUTION) ×2 IMPLANT
PACK GENERAL/GYN (CUSTOM PROCEDURE TRAY) ×2 IMPLANT
SPONGE GAUZE 4X4 12PLY (GAUZE/BANDAGES/DRESSINGS) IMPLANT
STRIP CLOSURE SKIN 1/2X4 (GAUZE/BANDAGES/DRESSINGS) IMPLANT
SUT MNCRL AB 4-0 PS2 18 (SUTURE) ×2 IMPLANT
SUT PROLENE 2 0 SH DA (SUTURE) ×5 IMPLANT
SUT SILK 2 0 SH (SUTURE) IMPLANT
SUT VIC AB 2-0 SH 27 (SUTURE) ×4
SUT VIC AB 2-0 SH 27X BRD (SUTURE) ×1 IMPLANT
SUT VIC AB 3-0 54XBRD REEL (SUTURE) ×1 IMPLANT
SUT VIC AB 3-0 BRD 54 (SUTURE) ×2
SUT VIC AB 3-0 SH 27 (SUTURE) ×2
SUT VIC AB 3-0 SH 27XBRD (SUTURE) ×1 IMPLANT
SYR CONTROL 10ML LL (SYRINGE) ×2 IMPLANT
TOWEL OR 17X26 10 PK STRL BLUE (TOWEL DISPOSABLE) ×2 IMPLANT

## 2011-11-08 NOTE — Op Note (Signed)
Preoperative diagnosis: Symptomatic right inguinal hernia Postoperative diagnosis: Direct right inguinal hernia Procedure: Right inguinal hernia repair with UltraPro hernia system Surgeon: Dr. Harden Mo Anesthesia: Gen. Specimens: None Estimated blood loss: Minimal Complications: None Drains: None Sponge needle count was correct x2 at end of operation Disposition to recovery in stable condition  Indications: This is a 61 year old male with a symptomatic right inguinal hernia that we discussed an open repair.  Procedure: After informed consent was obtained the patient was taken to the operating room. He was in this her 1 g of intravenous vancomycin due to his allergies. Sequential compression devices were placed on his legs. He was then placed under general anesthesia without complication. His right groin was then prepped and draped in the standard sterile surgical fashion. A surgical timeout was performed.  I then infiltrated quarter percent Marcaine throughout his right groin as well as performing an ilioinguinal nerve block. I then made a right groin incision and carried this out down to the external abdominal oblique. The superficial epigastric vein was cauterized. I then entered the external abdominal oblique through the external ring. The spermatic cord was then encircled. He was noted to have a very large direct inguinal hernia. He had no indirect hernia present. The direct hernia sac was freed from the surrounding structures and I entered into the preperitoneal space. I then used a Ray-Tec sponge to push this tissue cephalad. I then elected to place a UltraPro hernia system. I placed the bottom portion of the bilayer in the preperitoneal space overlying Cooper's ligament. This was all spread flat and was in good position. I then closed the internal oblique to the shelving edge with 2-0 Vicryl suture. I then deployed the top portion of the bilayer and made a cut to wrap it around the  spermatic cord. I used 2-0 Prolene to secure this to the pubic tubercle and inguinal ligament inferiorly. This was secured to the internal oblique superiorly. The lateral limbs were laid flat under the external oblique. This completely obliterated the defect. Hemostasis was observed. I then closed the external oblique with a 2-0 Vicryl. Scarpa's fascia was closed with a 3-0 Vicryl. The skin was closed with 4-0 Monocryl in a subcuticular fashion. I infiltrated additional 10 cc of quarter percent Marcaine. Dermabond was placed over the incision. His testicles in the scrotum upon completion. He was awakened in the operative and transferred to recovery in stable condition.

## 2011-11-08 NOTE — Discharge Instructions (Signed)
[facility] ° °UMBILICAL OR INGUINAL HERNIA REPAIR: POST OP INSTRUCTIONS ° °Always review your discharge instruction sheet given to you by the facility where your surgery was performed. °IF YOU HAVE DISABILITY OR FAMILY LEAVE FORMS, YOU MUST BRING THEM TO THE OFFICE FOR PROCESSING.   °DO NOT GIVE THEM TO YOUR DOCTOR. ° °1. A  prescription for pain medication may be given to you upon discharge.  Take your pain medication as prescribed, if needed.  If narcotic pain medicine is not needed, then you may take acetaminophen (Tylenol), naprosyn (Alleve) or ibuprofen (Advil) as needed. °2. Take your usually prescribed medications unless otherwise directed. °3. If you need a refill on your pain medication, please contact your pharmacy.  They will contact our office to request authorization. Prescriptions will not be filled after 5 pm or on week-ends. °4. You should follow a light diet the first 24 hours after arrival home, such as soup and crackers, etc.  Be sure to include lots of fluids daily.  Resume your normal diet the day after surgery. °5. Most patients will experience some swelling and bruising around the umbilicus or in the groin and scrotum.  Ice packs and reclining will help.  Swelling and bruising can take several days to resolve.  °6. It is common to experience some constipation if taking pain medication after surgery.  Increasing fluid intake and taking a stool softener (such as Colace) will usually help or prevent this problem from occurring.  A mild laxative (Milk of Magnesia or Miralax) should be taken according to package directions if there are no bowel movements after 48 hours. °7. Unless discharge instructions indicate otherwise, you may remove your bandages 48 hours after surgery, and you may shower at that time.  You may have steri-strips (small skin tapes) in place directly over the incision.  These strips should be left on the skin for 7-10 days and will come off on their own.   If your surgeon used skin glue on the incision, you may shower in 24 hours.  The glue will flake off over the next 2-3 weeks.  Any sutures or staples will be removed at the office during your follow-up visit. °8. ACTIVITIES:  You may resume regular (light) daily activities beginning the next day--such as daily self-care, walking, climbing stairs--gradually increasing activities as tolerated.  You may have sexual intercourse when it is comfortable.  Refrain from any heavy lifting or straining until approved by your doctor. °a. You may drive when you are no longer taking prescription pain medication, you can comfortably wear a seatbelt, and you can safely maneuver your car and apply brakes. °b. RETURN TO WORK:  __________________________________________________________ °9. You should see your doctor in the office for a follow-up appointment approximately 2-3 weeks after your surgery.  Make sure that you call for this appointment within a day or two after you arrive home to insure a convenient appointment time. °10. OTHER INSTRUCTIONS:  __________________________________________________________________________________________________________________________________________________________________________________________  °WHEN TO CALL YOUR DOCTOR: °1. Fever over 101.0 °2. Inability to urinate °3. Nausea and/or vomiting °4. Extreme swelling or bruising °5. Continued bleeding from incision. °6. Increased pain, redness, or drainage from the incision ° °The clinic staff is available to answer your questions during regular business hours.  Please don’t hesitate to call and ask to speak to one of the nurses for clinical concerns.  If you have a medical emergency, go to the nearest emergency room or call 911.  A surgeon from [facility]   is always on call at the hospital ° ° °[address], [address], [place], [place]  [address] ? ° [address] [address], [place], [place]   [address] °[phone] ? [phone] ? FAX  [phone] °Web site: [email or web address] ° ° °

## 2011-11-08 NOTE — Interval H&P Note (Signed)
History and Physical Interval Note:  11/08/2011 9:16 AM  [name]  has presented today for surgery, with the diagnosis of right ingunial hernia  The various methods of treatment have been discussed with the patient and family. After consideration of risks, benefits and other options for treatment, the patient has consented to  Procedure(s) (LRB): HERNIA REPAIR INGUINAL ADULT (Right) INSERTION OF MESH (Right) as a surgical intervention .  The patients' history has been reviewed, patient examined, no change in status, stable for surgery.  I have reviewed the patients' chart and labs.  Questions were answered to the patient's satisfaction.     Rafaela Dinius

## 2011-11-08 NOTE — Transfer of Care (Signed)
Immediate Anesthesia Transfer of Care Note  Patient: [name]  Procedure(s) Performed: Procedure(s) (LRB): HERNIA REPAIR INGUINAL ADULT (Right) INSERTION OF MESH (Right)  Patient Location: PACU  Anesthesia Type: General  Level of Consciousness: sedated, patient cooperative and responds to stimulaton  Airway & Oxygen Therapy: Patient Spontanous Breathing and Patient connected to face mask oxgen  Post-op Assessment: Report given to PACU RN and Post -op Vital signs reviewed and stable  Post vital signs: Reviewed and stable  Complications: No apparent anesthesia complications

## 2011-11-08 NOTE — Progress Notes (Signed)
Patient  With " Teach back " done Call to Dr Dwain Sarna.  Patient to receive ordered morphine IV prior to DC.

## 2011-11-08 NOTE — H&P (View-Only) (Signed)
Patient ID: [name], male   DOB: 09/30/1950, 60 y.o.   MRN: [id number]  Chief Complaint  Patient presents with  . Inguinal Hernia    HPI [name] is a 60 y.o. male.  Self referral HPI This is a 60-year-old male who presents with a right groin mass and swelling for the past couple of months. His bothers him more when he is sitting. That's fine when he lays down it goes away. He has no real change in his bowel movements. He has some constipation due to his medications that he is on. He was diagnosed with inguinal hernia by an outside surgeon and comes in to discuss this.  Past Medical History  Diagnosis Date  . Arthritis   . Cancer   . Inguinal hernia     right  . Constipation   . Nasal congestion     Past Surgical History  Procedure Date  . Shoulder surgery 1998    right - scope  . Knee surgery 2009    right - scope  . Cervical fusion 1996, 1998, 2010    C6-C7, C5-C6, C4-C5    History reviewed. No pertinent family history.  Social History History  Substance Use Topics  . Smoking status: Former Smoker    Quit date: 10/16/1993  . Smokeless tobacco: Not on file  . Alcohol Use: No    Allergies  Allergen Reactions  . Penicillins Hives    All over the body.    Current Outpatient Prescriptions  Medication Sig Dispense Refill  . alfuzosin (UROXATRAL) 10 MG 24 hr tablet Daily.      . ALPRAZolam (XANAX) 0.5 MG tablet Take 0.5 mg by mouth at bedtime as needed.      . NASONEX 50 MCG/ACT nasal spray as needed.      . NON FORMULARY Taking lactose, about a tablespoon a day. Per patient in liquid form. Has to take to help keep regular bm's due to pain medications taken.      . oxymorphone (OPANA ER) 20 MG 12 hr tablet Take 20 mg by mouth every 12 (twelve) hours.      . pantoprazole (PROTONIX) 40 MG tablet Daily.      . traZODone (DESYREL) 50 MG tablet Daily.      . zolpidem (AMBIEN) 10 MG tablet Daily.        Review of Systems Review of Systems    Constitutional: Negative for fever, chills and unexpected weight change.  HENT: Positive for congestion. Negative for hearing loss, sore throat, trouble swallowing and voice change.   Eyes: Negative for visual disturbance.  Respiratory: Negative for cough and wheezing.   Cardiovascular: Negative for chest pain, palpitations and leg swelling.  Gastrointestinal: Positive for constipation. Negative for nausea, vomiting, abdominal pain, diarrhea, blood in stool, abdominal distention, anal bleeding and rectal pain.  Genitourinary: Negative for hematuria and difficulty urinating.  Musculoskeletal: Negative for arthralgias.  Skin: Negative for rash and wound.  Neurological: Negative for seizures, syncope, weakness and headaches.  Hematological: Negative for adenopathy. Does not bruise/bleed easily.  Psychiatric/Behavioral: Negative for confusion.    Blood pressure 146/92, pulse 78, temperature 98.4 F (36.9 C), temperature source Temporal, resp. rate 18, height 5' 10" (1.778 m), weight 190 lb 8 oz (86.41 kg).  Physical Exam Physical Exam  Vitals reviewed. Constitutional: He appears well-developed and well-nourished.  Cardiovascular: Normal rate, regular rhythm and normal heart sounds.   Pulmonary/Chest: Effort normal and breath sounds normal. He has no wheezes. He has   no rales.  Abdominal: Soft. Bowel sounds are normal. There is no tenderness. A hernia is present. Hernia confirmed positive in the right inguinal area. Hernia confirmed negative in the left inguinal area.  Genitourinary: Penis normal.  Lymphadenopathy:       Right: No inguinal adenopathy present.       Left: No inguinal adenopathy present.     Assessment    RIH, symptomatic    Plan    We discussed observation versus repair.  We discussed both laparoscopic and open inguinal hernia repairs. I described the procedure in detail.  The patient was given educational material.  Goals should be achieved with surgery. We  discussed the usage of mesh and the rationale behind that. We went over the pathophysiology of inguinal hernia. We have elected to perform open inguinal hernia repair with mesh.  We discussed the risks including bleeding, infection, recurrence, postoperative pain and chronic groin pain, testicular injury, urinary retention, numbness in groin and around incision.        Rashed Edler 10/17/2011, 9:57 AM    

## 2011-11-08 NOTE — Anesthesia Postprocedure Evaluation (Signed)
  Anesthesia Post-op Note  Patient: [name]  Procedure(s) Performed: Procedure(s) (LRB): HERNIA REPAIR INGUINAL ADULT (Right) INSERTION OF MESH (Right)  Patient Location: PACU  Anesthesia Type: General  Level of Consciousness: awake and alert   Airway and Oxygen Therapy: Patient Spontanous Breathing  Post-op Pain: mild  Post-op Assessment: Post-op Vital signs reviewed, Patient's Cardiovascular Status Stable, Respiratory Function Stable, Patent Airway and No signs of Nausea or vomiting  Post-op Vital Signs: stable  Complications: No apparent anesthesia complications

## 2011-11-08 NOTE — Anesthesia Preprocedure Evaluation (Addendum)
Anesthesia Evaluation  Patient identified by MRN, date of birth, ID band Patient awake    Reviewed: Allergy & Precautions, H&P , NPO status , Patient's Chart, lab work & pertinent test results  History of Anesthesia Complications (+) PONV  Airway Mallampati: II TM Distance: <3 FB Neck ROM: Full    Dental No notable dental hx.    Pulmonary neg pulmonary ROS,  breath sounds clear to auscultation  Pulmonary exam normal       Cardiovascular negative cardio ROS  Rhythm:Regular Rate:Normal     Neuro/Psych Chronic narcotic use negative neurological ROS  negative psych ROS   GI/Hepatic Neg liver ROS, GERD-  Medicated,  Endo/Other  negative endocrine ROS  Renal/GU negative Renal ROS  negative genitourinary   Musculoskeletal negative musculoskeletal ROS (+)   Abdominal   Peds negative pediatric ROS (+)  Hematology negative hematology ROS (+)   Anesthesia Other Findings   Reproductive/Obstetrics negative OB ROS                          Anesthesia Physical Anesthesia Plan  ASA: II  Anesthesia Plan: General   Post-op Pain Management:    Induction: Intravenous  Airway Management Planned: Oral ETT and LMA  Additional Equipment:   Intra-op Plan:   Post-operative Plan: Extubation in OR  Informed Consent: I have reviewed the patients History and Physical, chart, labs and discussed the procedure including the risks, benefits and alternatives for the proposed anesthesia with the patient or authorized representative who has indicated his/her understanding and acceptance.   Dental advisory given  Plan Discussed with: CRNA  Anesthesia Plan Comments:        Anesthesia Quick Evaluation

## 2011-11-09 ENCOUNTER — Encounter (HOSPITAL_COMMUNITY): Payer: Self-pay | Admitting: General Surgery

## 2011-11-14 ENCOUNTER — Telehealth (INDEPENDENT_AMBULATORY_CARE_PROVIDER_SITE_OTHER): Payer: Self-pay | Admitting: General Surgery

## 2011-11-14 MED ORDER — HYDROCODONE-ACETAMINOPHEN 5-325 MG PO TABS: 1.0000 | ORAL_TABLET | Freq: Four times a day (QID) | ORAL | Status: AC | PRN

## 2011-11-14 NOTE — Telephone Encounter (Signed)
Patient called in status post hernia repair for a refill of his oxycodone. He is okay with Norco per protocol. RX called to [facility]. Patient will call with any problems.

## 2011-11-21 ENCOUNTER — Telehealth (INDEPENDENT_AMBULATORY_CARE_PROVIDER_SITE_OTHER): Payer: Self-pay | Admitting: General Surgery

## 2011-11-21 NOTE — Telephone Encounter (Signed)
Pt calling and states he is doing quite well overall.  Today he has noted some slight  increased swelling at [facility] site.  He admits he is trying to up moving around more.  Suggested he may be overextending himself a bit and the swelling is his reminder to slow down a bit.  Try some ice to the site and consider using ibuprofen or Aleve during the day, saving the Vicodin for nighttime dose.  He understands and will comply.

## 2011-11-22 ENCOUNTER — Encounter (INDEPENDENT_AMBULATORY_CARE_PROVIDER_SITE_OTHER): Payer: Medicare Other | Admitting: General Surgery

## 2011-11-29 ENCOUNTER — Ambulatory Visit (INDEPENDENT_AMBULATORY_CARE_PROVIDER_SITE_OTHER): Payer: Medicare Other | Admitting: General Surgery

## 2011-11-29 ENCOUNTER — Encounter (INDEPENDENT_AMBULATORY_CARE_PROVIDER_SITE_OTHER): Payer: Self-pay | Admitting: General Surgery

## 2011-11-29 VITALS — BP 130/82 | HR 72 | Temp 98.4°F | Ht 70.0 in | Wt 184.8 lb

## 2011-11-29 DIAGNOSIS — Z09 Encounter for follow-up examination after completed treatment for conditions other than malignant neoplasm: Secondary | ICD-10-CM

## 2011-11-29 MED ORDER — OXYCODONE-ACETAMINOPHEN 10-325 MG PO TABS: 1.0000 | ORAL_TABLET | Freq: Four times a day (QID) | ORAL | Status: DC | PRN

## 2011-11-29 NOTE — Progress Notes (Signed)
Subjective:     Patient ID: [name], male   DOB: 16-Jun-1950, 61 y.o.   MRN: [id number]  HPI  This is a 61 year old male fairly large retinal hernia. I repaired this with mesh about 3 weeks ago. He had some significant discomfort and swelling postoperatively area this is now resolving he is doing much better. He is walking around. His pain is under good control at this point. His swelling is all gone completely down now as well. Review of Systems     Objective:   Physical Exam Right groin incision well healed without infection or edema    Assessment:     S/p RIH repair    Plan:     He is doing well after his hernia repair. He had the expected amount of pain and swelling for a fairly large hernia. I told him in 2 more weeks he canresume all his normal activity and I will follow him up as needed.

## 2012-02-10 ENCOUNTER — Telehealth (INDEPENDENT_AMBULATORY_CARE_PROVIDER_SITE_OTHER): Payer: Self-pay | Admitting: General Surgery

## 2012-02-10 NOTE — Telephone Encounter (Signed)
PT CALLED TO REPORT THAT HE HAS NOTICED RT SIDE/INGUINAL AREA SWELLING ABOVE SURGERY INCISION FOR THE LAST 4-5 DAYS/ SURGERY WAS IN June. SWELLING IS NOT EVIDENT WHEN HE IS LAYING ON HIS BACK. EATING OK, NO NAUSEA OR VOMITING AND BOWELS ARE MOVING. I WILL CHECK WITH DR. Dwain Sarna ON Monday FOR POSSIBLE APPT NEXT WEEK WITH DR. Dwain Sarna OR PARTNER. PT WILL ALSO CALL ON Monday/GY

## 2012-02-10 NOTE — Telephone Encounter (Signed)
PT CALLED TO INFORM DR. Dwain Sarna THAT HE HAS NOTICED BULGING/SWELLING ABOVE RT INGUINAL HERNIA REPAIR SITE OVER LAST 4-5 DAYS/ IT IS REDUCED WHEN HE IS LAYING DOWN ON HIS BACK. NO REDNESS OR DRAINAGE NOTED. EATING OK AND BOWELS ARE MOVING. SHOULD HE BE SEEN BY A PARTNER OR CAN YOU SEE HIM NEXT WEEK? THANKS/GY

## 2012-02-12 NOTE — Telephone Encounter (Signed)
[name], I think he is ok until the following week if that is ok with him. MW

## 2012-02-13 ENCOUNTER — Telehealth (INDEPENDENT_AMBULATORY_CARE_PROVIDER_SITE_OTHER): Payer: Self-pay

## 2012-02-13 NOTE — Telephone Encounter (Signed)
Returned pt's call. The pt is just concerned about some swelling after sx. I advised pt that Dr Dwain Sarna did review the message and he doesn't seem alarmed with the swelling. Dr Dwain Sarna just advised for the pt to be seen next week. I made an appt for the pt on 9/18 if the pt has any problems to call us back. The pt understands.

## 2012-02-22 ENCOUNTER — Encounter (INDEPENDENT_AMBULATORY_CARE_PROVIDER_SITE_OTHER): Payer: Self-pay | Admitting: General Surgery

## 2012-02-22 ENCOUNTER — Ambulatory Visit (INDEPENDENT_AMBULATORY_CARE_PROVIDER_SITE_OTHER): Payer: Medicare Other | Admitting: General Surgery

## 2012-02-22 VITALS — BP 136/80 | HR 69 | Temp 97.2°F | Ht 70.0 in | Wt 189.5 lb

## 2012-02-22 DIAGNOSIS — R1031 Right lower quadrant pain: Secondary | ICD-10-CM

## 2012-02-22 NOTE — Progress Notes (Signed)
Subjective:     Patient ID: SHELL YANDOW, male   DOB: [birth year], 61 y.o.   MRN: [id number]  HPI This is a 61 year old male who I did a right inguinal hernia repair with mesh in June of 2013. Several weeks ago he noted that he had some discomfort is some swelling in his right groin. He does not note any inciting episode. This is now getting better. The discomfort as well as the swelling is considerably better at this point. He does not notice a bulge. He comes in today to reevaluate.  Review of Systems     Objective:   Physical Exam Right groin incision that is well healed. He has some mild tenderness around this incision. There is no real edema today. There is no inguinal hernia present on the right side.    Assessment:     Right groin pain status post hernia repair in June    Plan:     I think he just irritated the repair. This is getting better. I do not feel anything concerning on my exam today. I'm going to see him back in 3 weeks I told him if he is back to normal to cancel the appointment. I do not think he has a recurrent hernia.

## 2012-03-16 ENCOUNTER — Encounter (INDEPENDENT_AMBULATORY_CARE_PROVIDER_SITE_OTHER): Payer: Medicare Other | Admitting: General Surgery

## 2012-06-18 ENCOUNTER — Ambulatory Visit (HOSPITAL_COMMUNITY)
Admission: RE | Admit: 2012-06-18 | Discharge: 2012-06-18 | Disposition: A | Discharge status: Home / Self Care | Payer: Medicare Other | Source: Ambulatory Visit | Attending: Family Medicine | Admitting: Family Medicine

## 2012-06-18 ENCOUNTER — Other Ambulatory Visit (HOSPITAL_COMMUNITY): Payer: Self-pay | Admitting: Family Medicine

## 2012-06-18 DIAGNOSIS — F17200 Nicotine dependence, unspecified, uncomplicated: Secondary | ICD-10-CM

## 2012-06-26 ENCOUNTER — Telehealth: Payer: Self-pay

## 2012-06-26 NOTE — Telephone Encounter (Signed)
LMOM to call.

## 2012-06-26 NOTE — Telephone Encounter (Signed)
Pt was referred by Dr. Renard Matter for screening colonoscopy. On pain meds and nerve meds. OV to discuss and schedule.  ( Pt had an accident in June of 1995 and has been on pain meds since then for cervical problems).  OV with Lorenza Burton, NP on 07/11/2012 at 2:00 pm.

## 2012-07-11 ENCOUNTER — Ambulatory Visit (INDEPENDENT_AMBULATORY_CARE_PROVIDER_SITE_OTHER): Payer: Medicare Other | Admitting: Gastroenterology

## 2012-07-11 ENCOUNTER — Encounter: Payer: Self-pay | Admitting: Gastroenterology

## 2012-07-11 ENCOUNTER — Encounter (HOSPITAL_COMMUNITY): Payer: Self-pay | Admitting: Pharmacy Technician

## 2012-07-11 VITALS — BP 126/84 | HR 58 | Temp 98.1°F | Ht 70.0 in | Wt 194.4 lb

## 2012-07-11 DIAGNOSIS — Z1211 Encounter for screening for malignant neoplasm of colon: Secondary | ICD-10-CM

## 2012-07-11 MED ORDER — PEG 3350-KCL-NA BICARB-NACL 420 G PO SOLR: 4000.0000 mL | ORAL | Status: DC

## 2012-07-11 NOTE — Patient Instructions (Addendum)
We have set you up for a colonoscopy with Dr. Fields in the near future.  Further recommendations to follow!   

## 2012-07-11 NOTE — Progress Notes (Signed)
Faxed to PCP

## 2012-07-11 NOTE — Progress Notes (Signed)
Primary Care Physician:  Alice Reichert, MD Primary Gastroenterologist:  Dr. Darrick Penna   Chief Complaint  Patient presents with  . Colonoscopy    HPI:   [name] is a 62 year old male who presents today at the request of Dr. Renard Matter for a visit prior to initial screening colonoscopy. He is somewhat nervous about this,and he is worried about sedation.  Denies abdominal pain, N/V. No wt loss, lack of appetite. No rectal bleeding. Chronic constipation. BMs vary, has started eating pitted prunes, which helps. Rare reflux. He tells me he has experienced post-op nausea and vomiting in the past.    Past Medical History  Diagnosis Date  . Arthritis   . Constipation   . Nasal congestion   . PONV (postoperative nausea and vomiting)   . GERD (gastroesophageal reflux disease)   . H/O jaundice     AS CHILD  . BPH (benign prostatic hyperplasia)   . Difficulty sleeping   . Anxiety   . Cancer     SKIN  . Hypertension   . Chronic neck pain     narcotics, work injury in the 1990s, s/p multiple procedures    Past Surgical History  Procedure Date  . Shoulder surgery 1998    right - scope  . Knee surgery 2009    right - scope  . Cervical fusion 1996, 1998, 2010    C6-C7, C5-C6, C4-C5  . Tonsillectomy   . Inguinal hernia repair 11/08/2011    Procedure: HERNIA REPAIR INGUINAL ADULT;  Surgeon: Emelia Loron, MD;  Location: [facility] ORS;  Service: General;  Laterality: Right;    Current Outpatient Prescriptions  Medication Sig Dispense Refill  . alfuzosin (UROXATRAL) 10 MG 24 hr tablet Take 10 mg by mouth Daily.       . cetirizine (ZYRTEC) 10 MG tablet Take 10 mg by mouth daily.      Marland Kitchen HYDROmorphone HCl (EXALGO) 12 MG T24A Take 24 mg by mouth 2 (two) times daily as needed. Pain      . propranolol (INDERAL) 20 MG tablet Take 20 mg by mouth daily.       . ranitidine (ZANTAC) 150 MG capsule Take 150 mg by mouth at bedtime as needed. Acid Reflux      . senna-docusate (SENOKOT-S) 8.6-50 MG per  tablet Take 1 tablet by mouth daily.      Marland Kitchen ALPRAZolam (XANAX) 1 MG tablet Take 1 mg by mouth 3 (three) times daily as needed. Anxiety      . lactulose (CHRONULAC) 10 GM/15ML solution Take 10 g by mouth 2 (two) times daily.      . Multiple Vitamin (MULTIVITAMIN WITH MINERALS) TABS Take 1 tablet by mouth daily.      . polyethylene glycol-electrolytes (TRILYTE) 420 G solution Take 4,000 mLs by mouth as directed.  4000 mL  0    Allergies as of 07/11/2012 - Review Complete 07/11/2012  Allergen Reaction Noted  . Penicillins Hives 10/17/2011    Family History  Problem Relation Age of Onset  . Colon cancer Neg Hx     History   Social History  . Marital Status: Divorced    Spouse Name: N/A    Number of Children: N/A  . Years of Education: N/A   Occupational History  . Not on file.   Social History Main Topics  . Smoking status: Former Smoker    Quit date: 10/16/1993  . Smokeless tobacco: Never Used  . Alcohol Use: No  . Drug Use: No  .  Sexually Active: Not on file   Other Topics Concern  . Not on file   Social History Narrative  . No narrative on file    Review of Systems: Gen: Denies any fever, chills, fatigue, weight loss, lack of appetite.  CV: rare palpitations Resp: Denies shortness of breath at rest or with exertion. Denies wheezing or cough.  GI: SEE HPI GU : Denies urinary burning, urinary frequency, urinary hesitancy MS: +cervical pain, chronic Derm: Denies rash, itching, dry skin Psych: age-related memory loss Heme: Denies bruising, bleeding, and enlarged lymph nodes.  Physical Exam: BP 126/84  Pulse 58  Temp 98.1 F (36.7 C) (Oral)  Ht 5\' 10"  (1.778 m)  Wt 194 lb 6.4 oz (88.179 kg)  BMI 27.89 kg/m2 General:   Alert and oriented. Pleasant and cooperative. Well-nourished and well-developed.  Head:  Normocephalic and atraumatic. Eyes:  Without icterus, sclera clear and conjunctiva pink.  Ears:  Normal auditory acuity. Nose:  No deformity, discharge,   or lesions. Mouth:  No deformity or lesions, oral mucosa pink.  Neck:  Supple, without mass or thyromegaly. Lungs:  Clear to auscultation bilaterally. No wheezes, rales, or rhonchi. No distress.  Heart:  S1, S2 present without murmurs appreciated.  Abdomen:  +BS, soft, non-tender and non-distended. No HSM noted. No guarding or rebound. No masses appreciated.  Rectal:  Deferred  Msk:  Symmetrical without gross deformities. Normal posture. Extremities:  Without clubbing or edema. Neurologic:  Alert and  oriented x4;  grossly normal neurologically. Skin:  Intact without significant lesions or rashes. Cervical Nodes:  No significant cervical adenopathy. Psych:  Alert and cooperative. Normal mood and affect.

## 2012-07-11 NOTE — Assessment & Plan Note (Signed)
62 year old male with need for initial screening colonoscopy. No rectal bleeding, and he notes a hx of chronic constipation. No change in bowel habits. He does note post-op nausea and vomiting in the past. Due to his multiple medications and narcotics due to chronic neck pain, he will need to be sedated with Propofol.   Proceed with colonoscopy with Dr. Darrick Penna in the near future. The risks, benefits, and alternatives have been discussed in detail with the patient. They state understanding and desire to proceed.  PROPOFOL due to polypharmacy Consider adjunct Phenergan or Zofran at time of procedure: pt informed to let pre-op testing know when anesthesia meets with him.

## 2012-07-16 NOTE — Patient Instructions (Signed)
[name]  07/16/2012   Your procedure is scheduled on:  07/24/12  Report to [facility] [place] AM.  Call this number if you have problems the morning of surgery: [phone]   Remember:   Do not eat food or drink liquids after midnight.   Take these medicines the morning of surgery with A SIP OF WATER: xanax, zyrtec, inderal, zantac, pain pill if needed   Do not wear jewelry, make-up or nail polish.  Do not wear lotions, powders, or perfumes. You may wear deodorant.  Do not shave 48 hours prior to surgery. Men may shave face and neck.  Do not bring valuables to the hospital.  Contacts, dentures or bridgework may not be worn into surgery.  Leave suitcase in the car. After surgery it may be brought to your room.  For patients admitted to the hospital, checkout time is 11:00 AM the day of  discharge.   Patients discharged the day of surgery will not be allowed to drive  home.  Name and phone number of your driver: family  Special Instructions: N/A   Please read over the following fact sheets that you were given: Anesthesia Post-op Instructions and Care and Recovery After Surgery   PATIENT INSTRUCTIONS POST-ANESTHESIA  IMMEDIATELY FOLLOWING SURGERY:  Do not drive or operate machinery for the first twenty four hours after surgery.  Do not make any important decisions for twenty four hours after surgery or while taking narcotic pain medications or sedatives.  If you develop intractable nausea and vomiting or a severe headache please notify your doctor immediately.  FOLLOW-UP:  Please make an appointment with your surgeon as instructed. You do not need to follow up with anesthesia unless specifically instructed to do so.  WOUND CARE INSTRUCTIONS (if applicable):  Keep a dry clean dressing on the anesthesia/puncture wound site if there is drainage.  Once the wound has quit draining you may leave it open to air.  Generally you should leave the bandage intact for twenty four hours unless  there is drainage.  If the epidural site drains for more than 36-48 hours please call the anesthesia department.  QUESTIONS?:  Please feel free to call your physician or the hospital operator if you have any questions, and they will be happy to assist you.      Colonoscopy A colonoscopy is an exam to evaluate your entire colon. In this exam, your colon is cleansed. A long fiberoptic tube is inserted through your rectum and into your colon. The fiberoptic scope (endoscope) is a long bundle of enclosed and very flexible fibers. These fibers transmit light to the area examined and send images from that area to your caregiver. Discomfort is usually minimal. You may be given a drug to help you sleep (sedative) during or prior to the procedure. This exam helps to detect lumps (tumors), polyps, inflammation, and areas of bleeding. Your caregiver may also take a small piece of tissue (biopsy) that will be examined under a microscope. LET YOUR CAREGIVER KNOW ABOUT:   Allergies to food or medicine.  Medicines taken, including vitamins, herbs, eyedrops, over-the-counter medicines, and creams.  Use of steroids (by mouth or creams).  Previous problems with anesthetics or numbing medicines.  History of bleeding problems or blood clots.  Previous surgery.  Other health problems, including diabetes and kidney problems.  Possibility of pregnancy, if this applies. BEFORE THE PROCEDURE   A clear liquid diet may be required for 2 days before the exam.  Ask your  caregiver about changing or stopping your regular medications.  Liquid injections (enemas) or laxatives may be required.  A large amount of electrolyte solution may be given to you to drink over a short period of time. This solution is used to clean out your colon.  You should be present 60 minutes prior to your procedure or as directed by your caregiver. AFTER THE PROCEDURE   If you received a sedative or pain relieving medication, you will  need to arrange for someone to drive you home.  Occasionally, there is a little blood passed with the first bowel movement. Do not be concerned. FINDING OUT THE RESULTS OF YOUR TEST Not all test results are available during your visit. If your test results are not back during the visit, make an appointment with your caregiver to find out the results. Do not assume everything is normal if you have not heard from your caregiver or the medical facility. It is important for you to follow up on all of your test results. HOME CARE INSTRUCTIONS   It is not unusual to pass moderate amounts of gas and experience mild abdominal cramping following the procedure. This is due to air being used to inflate your colon during the exam. Walking or a warm pack on your belly (abdomen) may help.  You may resume all normal meals and activities after sedatives and medicines have worn off.  Only take over-the-counter or prescription medicines for pain, discomfort, or fever as directed by your caregiver. Do not use aspirin or blood thinners if a biopsy was taken. Consult your caregiver for medicine usage if biopsies were taken. SEEK IMMEDIATE MEDICAL CARE IF:   You have a fever.  You pass large blood clots or fill a toilet with blood following the procedure. This may also occur 10 to 14 days following the procedure. This is more likely if a biopsy was taken.  You develop abdominal pain that keeps getting worse and cannot be relieved with medicine. Document Released: 05/20/2000 Document Revised: 08/15/2011 Document Reviewed: 01/03/2008 [facility] Patient Information 2013 [place], [place].

## 2012-07-17 ENCOUNTER — Other Ambulatory Visit: Payer: Self-pay

## 2012-07-17 ENCOUNTER — Encounter (HOSPITAL_COMMUNITY): Payer: Self-pay

## 2012-07-17 ENCOUNTER — Encounter (HOSPITAL_COMMUNITY)
Admission: RE | Admit: 2012-07-17 | Discharge: 2012-07-17 | Disposition: A | Discharge status: Home / Self Care | Payer: Medicare Other | Source: Ambulatory Visit | Attending: Gastroenterology | Admitting: Gastroenterology

## 2012-07-17 LAB — BASIC METABOLIC PANEL
BUN: 12 mg/dL (ref 6–23)
CO2: 31 mEq/L (ref 19–32)
Calcium: 9.3 mg/dL (ref 8.4–10.5)
Chloride: 101 mEq/L (ref 96–112)
Creatinine, Ser: 1.18 mg/dL (ref 0.50–1.35)
GFR calc Af Amer: 76 mL/min — ABNORMAL LOW (ref >90)
GFR calc non Af Amer: 65 mL/min — ABNORMAL LOW (ref >90)
Glucose, Bld: 85 mg/dL (ref 70–99)
Potassium: 4.6 mEq/L (ref 3.5–5.1)
Sodium: 138 mEq/L (ref 135–145)

## 2012-07-17 LAB — HEMOGLOBIN AND HEMATOCRIT, BLOOD
HCT: 43.1 % (ref 39.0–52.0)
Hemoglobin: 14.6 g/dL (ref 13.0–17.0)

## 2012-07-24 ENCOUNTER — Ambulatory Visit (HOSPITAL_COMMUNITY): Payer: Medicare Other | Admitting: Anesthesiology

## 2012-07-24 ENCOUNTER — Encounter (HOSPITAL_COMMUNITY): Payer: Self-pay | Admitting: *Deleted

## 2012-07-24 ENCOUNTER — Encounter (HOSPITAL_COMMUNITY)
Admission: RE | Disposition: A | Discharge status: Home / Self Care | Payer: Self-pay | Source: Ambulatory Visit | Attending: Gastroenterology

## 2012-07-24 ENCOUNTER — Ambulatory Visit (HOSPITAL_COMMUNITY)
Admission: RE | Admit: 2012-07-24 | Discharge: 2012-07-24 | Disposition: A | Discharge status: Home / Self Care | Payer: Medicare Other | Source: Ambulatory Visit | Attending: Gastroenterology | Admitting: Gastroenterology

## 2012-07-24 ENCOUNTER — Encounter (HOSPITAL_COMMUNITY): Payer: Self-pay | Admitting: Anesthesiology

## 2012-07-24 DIAGNOSIS — Z01812 Encounter for preprocedural laboratory examination: Secondary | ICD-10-CM | POA: Insufficient documentation

## 2012-07-24 DIAGNOSIS — D126 Benign neoplasm of colon, unspecified: Secondary | ICD-10-CM

## 2012-07-24 DIAGNOSIS — Z8371 Family history of colonic polyps: Secondary | ICD-10-CM | POA: Insufficient documentation

## 2012-07-24 DIAGNOSIS — Z1211 Encounter for screening for malignant neoplasm of colon: Secondary | ICD-10-CM

## 2012-07-24 DIAGNOSIS — Z83719 Family history of colon polyps, unspecified: Secondary | ICD-10-CM

## 2012-07-24 DIAGNOSIS — Z01810 Encounter for preprocedural cardiovascular examination: Secondary | ICD-10-CM | POA: Insufficient documentation

## 2012-07-24 DIAGNOSIS — I10 Essential (primary) hypertension: Secondary | ICD-10-CM | POA: Insufficient documentation

## 2012-07-24 DIAGNOSIS — Z79899 Other long term (current) drug therapy: Secondary | ICD-10-CM | POA: Insufficient documentation

## 2012-07-24 DIAGNOSIS — K5730 Diverticulosis of large intestine without perforation or abscess without bleeding: Secondary | ICD-10-CM

## 2012-07-24 HISTORY — PX: COLONOSCOPY WITH PROPOFOL: SHX5780

## 2012-07-24 SURGERY — COLONOSCOPY WITH PROPOFOL
Anesthesia: Monitor Anesthesia Care

## 2012-07-24 MED ORDER — PROPOFOL 10 MG/ML IV EMUL
INTRAVENOUS | Status: AC
  Filled 2012-07-24: qty 20

## 2012-07-24 MED ORDER — STERILE WATER FOR IRRIGATION IR SOLN
Status: DC | PRN
  Administered 2012-07-24: 08:00:00

## 2012-07-24 MED ORDER — FENTANYL CITRATE 0.05 MG/ML IJ SOLN
25.0000 ug | INTRAMUSCULAR | Status: DC | PRN
  Administered 2012-07-24: 25 ug via INTRAVENOUS

## 2012-07-24 MED ORDER — GLYCOPYRROLATE 0.2 MG/ML IJ SOLN
INTRAMUSCULAR | Status: AC
  Filled 2012-07-24: qty 1

## 2012-07-24 MED ORDER — FENTANYL CITRATE 0.05 MG/ML IJ SOLN
INTRAMUSCULAR | Status: AC
  Filled 2012-07-24: qty 2

## 2012-07-24 MED ORDER — PROPOFOL INFUSION 10 MG/ML OPTIME
INTRAVENOUS | Status: DC | PRN
  Administered 2012-07-24: 75 ug/kg/min via INTRAVENOUS
  Administered 2012-07-24: 90 ug/kg/min via INTRAVENOUS

## 2012-07-24 MED ORDER — MIDAZOLAM HCL 2 MG/2ML IJ SOLN
1.0000 mg | INTRAMUSCULAR | Status: DC | PRN
  Administered 2012-07-24: 2 mg via INTRAVENOUS

## 2012-07-24 MED ORDER — MIDAZOLAM HCL 2 MG/2ML IJ SOLN
INTRAMUSCULAR | Status: AC
  Filled 2012-07-24: qty 2

## 2012-07-24 MED ORDER — WATER FOR IRRIGATION, STERILE IR SOLN
Status: DC | PRN
  Administered 2012-07-24: 1000 mL

## 2012-07-24 MED ORDER — ONDANSETRON HCL 4 MG/2ML IJ SOLN
INTRAMUSCULAR | Status: AC
  Filled 2012-07-24: qty 2

## 2012-07-24 MED ORDER — LACTATED RINGERS IV SOLN
INTRAVENOUS | Status: DC | PRN
  Administered 2012-07-24: 07:00:00 via INTRAVENOUS

## 2012-07-24 MED ORDER — DEXAMETHASONE SODIUM PHOSPHATE 4 MG/ML IJ SOLN
INTRAMUSCULAR | Status: AC
  Filled 2012-07-24: qty 1

## 2012-07-24 MED ORDER — LACTATED RINGERS IV SOLN
INTRAVENOUS | Status: DC
  Administered 2012-07-24: 1000 mL via INTRAVENOUS

## 2012-07-24 MED ORDER — FENTANYL CITRATE 0.05 MG/ML IJ SOLN
INTRAMUSCULAR | Status: DC | PRN
  Administered 2012-07-24 (×4): 25 ug via INTRAVENOUS

## 2012-07-24 MED ORDER — MIDAZOLAM HCL 5 MG/5ML IJ SOLN
INTRAMUSCULAR | Status: DC | PRN
  Administered 2012-07-24: 2 mg via INTRAVENOUS

## 2012-07-24 MED ORDER — ONDANSETRON HCL 4 MG/2ML IJ SOLN: 4.0000 mg | Freq: Once | INTRAMUSCULAR | Status: DC | PRN

## 2012-07-24 MED ORDER — LIDOCAINE HCL (PF) 1 % IJ SOLN
INTRAMUSCULAR | Status: AC
  Filled 2012-07-24: qty 5

## 2012-07-24 MED ORDER — DEXAMETHASONE SODIUM PHOSPHATE 4 MG/ML IJ SOLN
4.0000 mg | Freq: Once | INTRAMUSCULAR | Status: AC
  Administered 2012-07-24: 4 mg via INTRAVENOUS

## 2012-07-24 MED ORDER — ONDANSETRON HCL 4 MG/2ML IJ SOLN
4.0000 mg | Freq: Once | INTRAMUSCULAR | Status: AC
  Administered 2012-07-24: 4 mg via INTRAVENOUS

## 2012-07-24 MED ORDER — GLYCOPYRROLATE 0.2 MG/ML IJ SOLN
0.2000 mg | Freq: Once | INTRAMUSCULAR | Status: AC
  Administered 2012-07-24: 0.2 mg via INTRAVENOUS

## 2012-07-24 MED ORDER — EPHEDRINE SULFATE 50 MG/ML IJ SOLN
INTRAMUSCULAR | Status: AC
  Filled 2012-07-24: qty 1

## 2012-07-24 MED ORDER — LIDOCAINE HCL (CARDIAC) 10 MG/ML IV SOLN
INTRAVENOUS | Status: DC | PRN
  Administered 2012-07-24: 50 mg via INTRAVENOUS

## 2012-07-24 MED ORDER — FENTANYL CITRATE 0.05 MG/ML IJ SOLN: 25.0000 ug | INTRAMUSCULAR | Status: DC | PRN

## 2012-07-24 SURGICAL SUPPLY — 22 items
ELECT REM PT RETURN 9FT ADLT (ELECTROSURGICAL)
ELECTRODE REM PT RTRN 9FT ADLT (ELECTROSURGICAL) IMPLANT
FCP BXJMBJMB 240X2.8X (CUTTING FORCEPS)
FLOOR PAD 36X40 (MISCELLANEOUS) ×2
FORCEPS BIOP RAD 4 LRG CAP 4 (CUTTING FORCEPS) ×1 IMPLANT
FORCEPS BIOP RJ4 240 W/NDL (CUTTING FORCEPS)
FORCEPS BXJMBJMB 240X2.8X (CUTTING FORCEPS) IMPLANT
INJECTOR/SNARE I SNARE (MISCELLANEOUS) ×1 IMPLANT
LUBRICANT JELLY 4.5OZ STERILE (MISCELLANEOUS) ×1 IMPLANT
MANIFOLD NEPTUNE II (INSTRUMENTS) ×1 IMPLANT
NDL SCLEROTHERAPY 25GX240 (NEEDLE) IMPLANT
NEEDLE SCLEROTHERAPY 25GX240 (NEEDLE) IMPLANT
PAD FLOOR 36X40 (MISCELLANEOUS) ×1 IMPLANT
PROBE APC STR FIRE (PROBE) IMPLANT
PROBE INJECTION GOLD (MISCELLANEOUS)
PROBE INJECTION GOLD 7FR (MISCELLANEOUS) IMPLANT
SNARE SHORT THROW 13M SML OVAL (MISCELLANEOUS) ×1 IMPLANT
SYR 50ML LL SCALE MARK (SYRINGE) ×1 IMPLANT
TRAP SPECIMEN MUCOUS 40CC (MISCELLANEOUS) IMPLANT
TUBING ENDO SMARTCAP PENTAX (MISCELLANEOUS) ×1 IMPLANT
TUBING IRRIGATION ENDOGATOR (MISCELLANEOUS) ×1 IMPLANT
WATER STERILE IRR 1000ML POUR (IV SOLUTION) ×1 IMPLANT

## 2012-07-24 NOTE — Anesthesia Preprocedure Evaluation (Signed)
Anesthesia Evaluation  Patient identified by MRN, date of birth, ID band Patient awake    Reviewed: Allergy & Precautions, H&P , NPO status , Patient's Chart, lab work & pertinent test results  History of Anesthesia Complications (+) PONV  Airway Mallampati: II TM Distance: <3 FB Neck ROM: Full    Dental no notable dental hx.    Pulmonary neg pulmonary ROS,  breath sounds clear to auscultation  Pulmonary exam normal       Cardiovascular hypertension, negative cardio ROS  Rhythm:Regular Rate:Normal     Neuro/Psych Anxiety Chronic narcotic use negative neurological ROS  negative psych ROS   GI/Hepatic Neg liver ROS, GERD-  Medicated,  Endo/Other  negative endocrine ROS  Renal/GU negative Renal ROS  negative genitourinary   Musculoskeletal negative musculoskeletal ROS (+)   Abdominal   Peds negative pediatric ROS (+)  Hematology negative hematology ROS (+)   Anesthesia Other Findings   Reproductive/Obstetrics negative OB ROS                           Anesthesia Physical Anesthesia Plan  ASA: II  Anesthesia Plan: MAC   Post-op Pain Management:    Induction: Intravenous  Airway Management Planned: Simple Face Mask  Additional Equipment:   Intra-op Plan:   Post-operative Plan:   Informed Consent: I have reviewed the patients History and Physical, chart, labs and discussed the procedure including the risks, benefits and alternatives for the proposed anesthesia with the patient or authorized representative who has indicated his/her understanding and acceptance.     Plan Discussed with:   Anesthesia Plan Comments:         Anesthesia Quick Evaluation

## 2012-07-24 NOTE — H&P (Signed)
Primary Care Physician:  Alice Reichert, MD Primary Gastroenterologist:  Dr. Darrick Penna  Pre-Procedure History & Physical: HPI:  [name] is a 62 y.o. male here for COLON CANCER SCREENING.  Past Medical History  Diagnosis Date  . Arthritis   . Constipation   . Nasal congestion   . PONV (postoperative nausea and vomiting)   . GERD (gastroesophageal reflux disease)   . H/O jaundice     AS CHILD  . BPH (benign prostatic hyperplasia)   . Difficulty sleeping   . Anxiety   . Cancer     SKIN  . Hypertension   . Chronic neck pain     narcotics, work injury in the 1990s, s/p multiple procedures    Past Surgical History  Procedure Laterality Date  . Shoulder surgery  1998    right - scope  . Knee surgery  2009    right - scope  . Cervical fusion  1996, 1998, 2010    C6-C7, C5-C6, C4-C5  . Tonsillectomy    . Inguinal hernia repair  11/08/2011    Procedure: HERNIA REPAIR INGUINAL ADULT;  Surgeon: Emelia Loron, MD;  Location: [facility];  Service: General;  Laterality: Right;  . Hernia repair      Prior to Admission medications   Medication Sig Start Date End Date Taking? Authorizing Provider  alfuzosin (UROXATRAL) 10 MG 24 hr tablet Take 10 mg by mouth Daily.  09/26/11  Yes Historical Provider, MD  ALPRAZolam Prudy Feeler) 1 MG tablet Take 1 mg by mouth 3 (three) times daily as needed. Anxiety   Yes Historical Provider, MD  cetirizine (ZYRTEC) 10 MG tablet Take 10 mg by mouth daily.   Yes Historical Provider, MD  HYDROmorphone HCl (EXALGO) 12 MG T24A Take 24 mg by mouth 2 (two) times daily as needed. Pain   Yes Historical Provider, MD  lactulose (CHRONULAC) 10 GM/15ML solution Take 10 g by mouth 2 (two) times daily.   Yes Historical Provider, MD  Multiple Vitamin (MULTIVITAMIN WITH MINERALS) TABS Take 1 tablet by mouth daily.   Yes Historical Provider, MD  polyethylene glycol-electrolytes (TRILYTE) 420 G solution Take 4,000 mLs by mouth as directed. 07/11/12  Yes West Bali, MD   propranolol (INDERAL) 20 MG tablet Take 20 mg by mouth daily.  06/21/12  Yes Historical Provider, MD  ranitidine (ZANTAC) 150 MG capsule Take 150 mg by mouth at bedtime as needed. Acid Reflux   Yes Historical Provider, MD  senna-docusate (SENOKOT-S) 8.6-50 MG per tablet Take 1 tablet by mouth daily.   Yes Historical Provider, MD    Allergies as of 07/11/2012 - Review Complete 07/11/2012  Allergen Reaction Noted  . Penicillins Hives 10/17/2011    Family History  Problem Relation Age of Onset  . Colon cancer Neg Hx     History   Social History  . Marital Status: Divorced    Spouse Name: N/A    Number of Children: N/A  . Years of Education: N/A   Occupational History  . Not on file.   Social History Main Topics  . Smoking status: Former Smoker    Quit date: 10/16/1993  . Smokeless tobacco: Never Used  . Alcohol Use: No  . Drug Use: No  . Sexually Active: Not on file   Other Topics Concern  . Not on file   Social History Narrative  . No narrative on file    Review of Systems: See HPI, otherwise negative ROS   Physical Exam: BP 135/92  Temp(Src) 97.7 F (36.5 C) (Oral)  Resp 10  SpO2 95% General:   Alert,  pleasant and cooperative in NAD Head:  Normocephalic and atraumatic. Neck:  Supple; Lungs:  Clear throughout to auscultation.    Heart:  Regular rate and rhythm. Abdomen:  Soft, nontender and nondistended. Normal bowel sounds, without guarding, and without rebound.   Neurologic:  Alert and  oriented x4;  grossly normal neurologically.  Impression/Plan:     SCREENING  Plan:  1. TCS TODAY

## 2012-07-24 NOTE — Anesthesia Postprocedure Evaluation (Signed)
  Anesthesia Post-op Note  Patient: [name]  Procedure(s) Performed: Procedure(s) with comments: Colonoscopy with propofol (N/A) - in cecum at 0754, total withdrawel time  Patient Location: PACU  Anesthesia Type: MAC  Level of Consciousness: awake, alert , oriented and patient cooperative  Airway and Oxygen Therapy: Patient Spontanous Breathing; room air  Post-op Pain: mild  Post-op Assessment: Post-op Vital signs reviewed, Patient's Cardiovascular Status Stable, Respiratory Function Stable, Patent Airway and No signs of Nausea or vomiting  Post-op Vital Signs: Reviewed and stable  Complications: No apparent anesthesia complications

## 2012-07-24 NOTE — Transfer of Care (Signed)
Immediate Anesthesia Transfer of Care Note  Patient: [name]  Procedure(s) Performed: Procedure(s) with comments: Colonoscopy with propofol (N/A) - in cecum at 0754  Patient Location: PACU  Anesthesia Type:MAC  Level of Consciousness: awake, alert , oriented and patient cooperative  Airway & Oxygen Therapy: Patient Spontanous Breathing  Post-op Assessment: Report given to PACU RN and Post -op Vital signs reviewed and stable  Post vital signs: Reviewed and stable  Complications: No apparent anesthesia complications

## 2012-07-24 NOTE — Progress Notes (Signed)
Awake. Wants something to drink and to go home. Coke given to drink. Tolerated well.

## 2012-07-24 NOTE — Preoperative (Signed)
Beta Blockers   Reason not to administer Beta Blockers:Not Applicable 

## 2012-07-24 NOTE — Op Note (Addendum)
[facility] [address] [place], [address]   COLONOSCOPY PROCEDURE REPORT  PATIENT: [name], [name]  MR#: [id number] BIRTHDATE: 07-01-1950 , 61  yrs. old GENDER: Male ENDOSCOPIST: Jonette Eva, MD REFERRED XB:JYNWG Renard Matter, M.D. PROCEDURE DATE:  07/24/2012 PROCEDURE:   Colonoscopy with cold forceps and snare polypectomy INDICATIONS:Average risk patient for colon cancer. FATHER HAD COLON POLYPS AGE >60 MEDICATIONS: MAC sedation, administered by CRNA  DESCRIPTION OF PROCEDURE:    Physical exam was performed.  Informed consent was obtained from the patient after explaining the benefits, risks, and alternatives to procedure.  The patient was connected to monitor and placed in left lateral position. Continuous oxygen was provided by nasal cannula and IV medicine administered through an indwelling cannula.  After administration of sedation and rectal exam, the patients rectum was intubated and the     colonoscope was advanced under direct visualization to the cecum.  The scope was removed slowly by carefully examining the color, texture, anatomy, and integrity mucosa on the way out.  The patient was recovered in endoscopy and discharged home in satisfactory condition.     COLON FINDINGS: Melanosis was found.  , Five polypoid shaped sessile polyps measuring 3-8 mm in size were found at the cecum, in the ascending colon, transverse colon, and sigmoid colon.  A polypectomy was performed using snare cautery and with cold forceps.  The resection was complete and the polyp tissue was completely retrieved, The colon was redundant.  Manual abdominal counter-pressure was used to reach the cecum.  The patient was moved on to their back to reach the cecum, Mild diverticulosis was noted throughout the entire examined colon.  , and Small internal hemorrhoids were found.  PREP QUALITY: good. CECAL W/D TIME: 20 minutes COMPLICATIONS: None  ENDOSCOPIC IMPRESSION: 1. 5  COLON POLYPS REMOVED 2. MILD PAN-COLONIC DIVERTICULOSIS 3. SMALL INTERNAL HEMORRHOIDS   RECOMMENDATIONS: FOLLOW A HIGH FIBER DIET.  AVOID ITEMS THAT CAUSE BLOATING. BIOPSY RESULTS SHOULD BE BACK IN 7 DAYS. Next colonoscopy in 10 years WITH PROPOFOL/OVERTUBE.      _______________________________ Gwendlyn Deutscher, MD 07/24/2012 12:05 PM Revised: 07/24/2012 12:05 PM    PATIENT NAME:  [name], [name] MR#: [id number]

## 2012-07-24 NOTE — Progress Notes (Signed)
Awake. Talking. Encouraged to pass air. Passing air without difficulty.

## 2012-07-24 NOTE — Anesthesia Procedure Notes (Signed)
Procedure Name: MAC Date/Time: 07/24/2012 7:28 AM Performed by: Carolyne Littles, AMY L Pre-anesthesia Checklist: Patient identified, Patient being monitored, Emergency Drugs available, Timeout performed and Suction available Oxygen Delivery Method: Non-rebreather mask

## 2012-07-25 ENCOUNTER — Encounter (HOSPITAL_COMMUNITY): Payer: Self-pay | Admitting: Gastroenterology

## 2012-07-25 NOTE — Addendum Note (Signed)
Addendum created 07/25/12 1045 by Franco Nones, CRNA   Modules edited: Charges VN

## 2012-07-27 ENCOUNTER — Telehealth: Payer: Self-pay | Admitting: Gastroenterology

## 2012-07-27 NOTE — Telephone Encounter (Signed)
Please call pt. He had A simple adenoma removed from hIS colon. FOLLOW A High fiber diet. TCS IN 10 YEARS. 

## 2012-07-30 NOTE — Telephone Encounter (Signed)
LMOM to call.

## 2012-07-30 NOTE — Telephone Encounter (Signed)
Pt called and was informed of results.  

## 2012-07-31 NOTE — Telephone Encounter (Signed)
Path faxed to PCP, recall made 

## 2012-08-30 ENCOUNTER — Other Ambulatory Visit: Payer: Self-pay

## 2012-08-30 DIAGNOSIS — G4700 Insomnia, unspecified: Secondary | ICD-10-CM

## 2012-09-06 ENCOUNTER — Ambulatory Visit: Payer: Medicare Other | Attending: Neurology | Admitting: Sleep Medicine

## 2012-09-06 VITALS — Ht 70.0 in | Wt 190.0 lb

## 2012-09-06 DIAGNOSIS — G4730 Sleep apnea, unspecified: Secondary | ICD-10-CM | POA: Insufficient documentation

## 2012-09-06 DIAGNOSIS — G4710 Hypersomnia, unspecified: Secondary | ICD-10-CM | POA: Insufficient documentation

## 2012-09-06 DIAGNOSIS — G4700 Insomnia, unspecified: Secondary | ICD-10-CM

## 2012-09-15 NOTE — Procedures (Signed)
[facility] Ruth Kovich A. Gerilyn Pilgrim, MD     [email or web address]        [name], [name]               ACCOUNT NO.:  [id number]  MEDICAL RECORD NO.:  [id number]          PATIENT TYPE:  OUT  LOCATION:  SLEEP LAB                     FACILITY:  [facility]  PHYSICIAN:  [name], M.D. DATE OF BIRTH:  07-15-1950  DATE OF STUDY:  09/06/2012                           NOCTURNAL POLYSOMNOGRAM  REFERRING PHYSICIAN:  Lysandra Loughmiller A. Gerilyn Jones, M.D.  INDICATIONS:  A 62 year old man who presents with restless sleep, snoring, and fatigue.   MEDICATIONS:  OxyContin, alprazolam, Inderal, lactulose, oxymetazoline nasal spray.  EPWORTH SLEEPINESS SCALE:  0.  BMI 27.  ARCHITECTURAL SUMMARY:  The total recording time is 385 minutes.  Sleep efficiency 24%.  Sleep latency 247 minutes.  REM latency 63 minutes. Stage N1 is 4%, N2 is 82%, N3 is 0%, and REM sleep 14%. Alpha intrusion noted during sleep.   RESPIRATORY SUMMARY:  Baseline oxygen saturation is 96, lowest saturation 90.  Diagnostic AHI is 1 and RDI 2.  LIMB MOVEMENT SUMMARY:  PLM index 0.  ELECTROCARDIOGRAM SUMMARY:  Average heart rate is 59 with no significant dysrhythmias observed.  IMPRESSION:  Abnormal sleep architecture with very poor sleep efficiency, absent slow wave sleep, and also alpha intrusion noted during sleep.    [name], M.D.    KAD/MEDQ  D:  09/15/2012 11:13:08  T:  09/15/2012 11:42:02  Job:  [id number]

## 2012-10-11 NOTE — Progress Notes (Signed)
FEB 2014 simple ADENOMA  REVIEWED.

## 2014-03-07 ENCOUNTER — Other Ambulatory Visit: Payer: Self-pay | Admitting: Anesthesiology

## 2014-03-07 DIAGNOSIS — M5412 Radiculopathy, cervical region: Secondary | ICD-10-CM

## 2014-03-17 ENCOUNTER — Other Ambulatory Visit: Payer: Medicare Other

## 2014-03-18 ENCOUNTER — Ambulatory Visit
Admission: RE | Admit: 2014-03-18 | Discharge: 2014-03-18 | Disposition: A | Discharge status: Home / Self Care | Payer: Worker's Compensation | Source: Ambulatory Visit | Attending: Anesthesiology | Admitting: Anesthesiology

## 2014-03-18 DIAGNOSIS — M5412 Radiculopathy, cervical region: Secondary | ICD-10-CM

## 2014-03-18 MED ORDER — GADOBENATE DIMEGLUMINE 529 MG/ML IV SOLN
19.0000 mL | Freq: Once | INTRAVENOUS | Status: AC | PRN
  Administered 2014-03-18: 19 mL via INTRAVENOUS

## 2014-04-07 ENCOUNTER — Ambulatory Visit (HOSPITAL_COMMUNITY)
Admission: RE | Admit: 2014-04-07 | Discharge: 2014-04-07 | Disposition: A | Discharge status: Home / Self Care | Payer: Medicare Other | Source: Ambulatory Visit | Attending: Family Medicine | Admitting: Family Medicine

## 2014-04-07 ENCOUNTER — Other Ambulatory Visit (HOSPITAL_COMMUNITY): Payer: Self-pay | Admitting: Family Medicine

## 2014-04-07 DIAGNOSIS — R059 Cough, unspecified: Secondary | ICD-10-CM

## 2014-04-07 DIAGNOSIS — R05 Cough: Secondary | ICD-10-CM

## 2014-04-07 DIAGNOSIS — R0789 Other chest pain: Secondary | ICD-10-CM | POA: Insufficient documentation

## 2014-05-01 ENCOUNTER — Emergency Department (HOSPITAL_COMMUNITY)
Admission: EM | Admit: 2014-05-01 | Discharge: 2014-05-01 | Disposition: A | Discharge status: Home / Self Care | Payer: Medicare Other | Attending: Emergency Medicine | Admitting: Emergency Medicine

## 2014-05-01 ENCOUNTER — Encounter (HOSPITAL_COMMUNITY): Payer: Self-pay | Admitting: *Deleted

## 2014-05-01 DIAGNOSIS — K219 Gastro-esophageal reflux disease without esophagitis: Secondary | ICD-10-CM | POA: Diagnosis not present

## 2014-05-01 DIAGNOSIS — Z87891 Personal history of nicotine dependence: Secondary | ICD-10-CM | POA: Insufficient documentation

## 2014-05-01 DIAGNOSIS — I10 Essential (primary) hypertension: Secondary | ICD-10-CM | POA: Insufficient documentation

## 2014-05-01 DIAGNOSIS — Z87448 Personal history of other diseases of urinary system: Secondary | ICD-10-CM | POA: Insufficient documentation

## 2014-05-01 DIAGNOSIS — Z8589 Personal history of malignant neoplasm of other organs and systems: Secondary | ICD-10-CM | POA: Diagnosis not present

## 2014-05-01 DIAGNOSIS — J029 Acute pharyngitis, unspecified: Secondary | ICD-10-CM | POA: Diagnosis not present

## 2014-05-01 DIAGNOSIS — Z880 Allergy status to penicillin: Secondary | ICD-10-CM | POA: Diagnosis not present

## 2014-05-01 DIAGNOSIS — Z8639 Personal history of other endocrine, nutritional and metabolic disease: Secondary | ICD-10-CM | POA: Diagnosis not present

## 2014-05-01 DIAGNOSIS — Z79899 Other long term (current) drug therapy: Secondary | ICD-10-CM | POA: Insufficient documentation

## 2014-05-01 DIAGNOSIS — F419 Anxiety disorder, unspecified: Secondary | ICD-10-CM | POA: Diagnosis not present

## 2014-05-01 DIAGNOSIS — G8929 Other chronic pain: Secondary | ICD-10-CM | POA: Insufficient documentation

## 2014-05-01 MED ORDER — IBUPROFEN 800 MG PO TABS
800.0000 mg | ORAL_TABLET | Freq: Once | ORAL | Status: AC
Start: 2014-05-01 — End: 2014-05-01
  Administered 2014-05-01: 800 mg via ORAL
  Filled 2014-05-01: qty 1

## 2014-05-01 MED ORDER — LIDOCAINE VISCOUS 2 % MT SOLN
15.0000 mL | Freq: Once | OROMUCOSAL | Status: AC
  Administered 2014-05-01: 15 mL via OROMUCOSAL
  Filled 2014-05-01: qty 15

## 2014-05-01 MED ORDER — CLINDAMYCIN HCL 150 MG PO CAPS
300.0000 mg | ORAL_CAPSULE | Freq: Once | ORAL | Status: AC
  Administered 2014-05-01: 300 mg via ORAL
  Filled 2014-05-01: qty 2

## 2014-05-01 MED ORDER — CLINDAMYCIN HCL 150 MG PO CAPS: 300.0000 mg | ORAL_CAPSULE | Freq: Four times a day (QID) | ORAL | Status: DC

## 2014-05-01 NOTE — ED Provider Notes (Signed)
CSN: [id number]     Arrival date & time 05/01/14  1739 History   First MD Initiated Contact with Patient 05/01/14 1810     Chief Complaint  Patient presents with  . Sore Throat     (Consider location/radiation/quality/duration/timing/severity/associated sxs/prior Treatment) HPI Comments: Patient is a 63 year old male who presents to the emergency department with 3 days of sore throat. The patient states that he has been seen by his primary physician 2 days ago, at which time he was placed on antibiotic erythromycin. The patient states that he seems to be getting worse, and even losing his voice. He has not had any rash. He has not had any new stiffness in his neck. He's not had any high fevers that have been measured at home. He has tried saltwater gargles but states these are not helping. He has pain with swallowing, but has been able to swallow a pop chart and a cracker today.  Patient is a 63 y.o. male presenting with pharyngitis. The history is provided by the patient.  Sore Throat Associated symptoms include congestion and a sore throat. Pertinent negatives include no abdominal pain, arthralgias, chest pain, coughing or neck pain.    Past Medical History  Diagnosis Date  . Arthritis   . Constipation   . Nasal congestion   . PONV (postoperative nausea and vomiting)   . GERD (gastroesophageal reflux disease)   . H/O jaundice     AS CHILD  . BPH (benign prostatic hyperplasia)   . Difficulty sleeping   . Anxiety   . Hypertension   . Chronic neck pain     narcotics, work injury in the 1990s, s/p multiple procedures  . Cancer     SKIN   Past Surgical History  Procedure Laterality Date  . Shoulder surgery  1998    right - scope  . Knee surgery  2009    right - scope  . Cervical fusion  1996, 1998, 2010    C6-C7, C5-C6, C4-C5  . Tonsillectomy    . Inguinal hernia repair  11/08/2011    Procedure: HERNIA REPAIR INGUINAL ADULT;  Surgeon: Rolm Bookbinder, MD;  Location: [facility];   Service: General;  Laterality: Right;  . Hernia repair    . Colonoscopy with propofol N/A 07/24/2012    Procedure: Colonoscopy with propofol;  Surgeon: Danie Binder, MD;  Location: [facility];  Service: Endoscopy;  Laterality: N/A;  in cecum at 0754, 89min total withdrawel time   Family History  Problem Relation Age of Onset  . Colon cancer Neg Hx    History  Substance Use Topics  . Smoking status: Former Smoker    Quit date: 10/16/1993  . Smokeless tobacco: Never Used  . Alcohol Use: No    Review of Systems  Constitutional: Negative for activity change.       All ROS Neg except as noted in HPI  HENT: Positive for congestion, sore throat, trouble swallowing and voice change. Negative for nosebleeds.   Eyes: Negative for photophobia and discharge.  Respiratory: Negative for cough, shortness of breath and wheezing.   Cardiovascular: Negative for chest pain and palpitations.  Gastrointestinal: Negative for abdominal pain and blood in stool.  Genitourinary: Negative for dysuria, frequency and hematuria.  Musculoskeletal: Negative for back pain, arthralgias and neck pain.  Skin: Negative.   Neurological: Negative for dizziness, seizures and speech difficulty.  Psychiatric/Behavioral: Negative for hallucinations and confusion.      Allergies  Penicillins  Home Medications   Prior  to Admission medications   Medication Sig Start Date End Date Taking? Authorizing Provider  alfuzosin (UROXATRAL) 10 MG 24 hr tablet Take 10 mg by mouth Daily.  09/26/11   Historical Provider, MD  ALPRAZolam Duanne Moron) 1 MG tablet Take 1 mg by mouth 3 (three) times daily as needed. Anxiety    Historical Provider, MD  cetirizine (ZYRTEC) 10 MG tablet Take 10 mg by mouth daily.    Historical Provider, MD  HYDROmorphone HCl (EXALGO) 12 MG T24A Take 24 mg by mouth 2 (two) times daily as needed. Pain    Historical Provider, MD  lactulose (CHRONULAC) 10 GM/15ML solution Take 10 g by mouth 2 (two) times daily.     Historical Provider, MD  Multiple Vitamin (MULTIVITAMIN WITH MINERALS) TABS Take 1 tablet by mouth daily.    Historical Provider, MD  polyethylene glycol-electrolytes (TRILYTE) 420 G solution Take 4,000 mLs by mouth as directed. 07/11/12   Danie Binder, MD  propranolol (INDERAL) 20 MG tablet Take 20 mg by mouth daily.  06/21/12   Historical Provider, MD  ranitidine (ZANTAC) 150 MG capsule Take 150 mg by mouth at bedtime as needed. Acid Reflux    Historical Provider, MD  senna-docusate (SENOKOT-S) 8.6-50 MG per tablet Take 1 tablet by mouth daily.    Historical Provider, MD   BP 140/75 mmHg  Pulse 63  Temp(Src) 99.2 F (37.3 C) (Oral)  Resp 18  Ht 5\' 10"  (1.778 m)  Wt 187 lb (84.823 kg)  BMI 26.83 kg/m2  SpO2 99% Physical Exam  Constitutional: He is oriented to person, place, and time. He appears well-developed and well-nourished.  Non-toxic appearance.  HENT:  Head: Normocephalic.  Right Ear: Tympanic membrane and external ear normal.  Left Ear: Tympanic membrane and external ear normal.  Mouth/Throat: Uvula is midline and mucous membranes are normal. Uvula swelling present. Posterior oropharyngeal erythema present.  Eyes: EOM and lids are normal. Pupils are equal, round, and reactive to light.  Neck: Normal range of motion. Neck supple. Carotid bruit is not present.  Cardiovascular: Normal rate, regular rhythm, normal heart sounds, intact distal pulses and normal pulses.   Pulmonary/Chest: Breath sounds normal. No respiratory distress.  Abdominal: Soft. Bowel sounds are normal. There is no tenderness. There is no guarding.  Musculoskeletal: Normal range of motion.  Lymphadenopathy:       Head (right side): No submandibular adenopathy present.       Head (left side): No submandibular adenopathy present.    He has no cervical adenopathy.  Neurological: He is alert and oriented to person, place, and time. He has normal strength. No cranial nerve deficit or sensory deficit.  Skin: Skin  is warm and dry.  Psychiatric: He has a normal mood and affect. His speech is normal.  Nursing note and vitals reviewed.   ED Course  Procedures (including critical care time) Labs Review Labs Reviewed - No data to display  Imaging Review No results found.   EKG Interpretation None      MDM  Vital signs are nonacute. Pulse oximetry is 98% on room air. Patient speaks in clear complete sentences. The examination is consistent with acute pharyngitis. The plan at this time is for the patient use salt water gargles, Chloraseptic spray, ibuprofen and Tylenol, will change the erythromycin to clindamycin. Patient has been advised to wash hands frequently and we will provide him a mask to use.    Final diagnoses:  None    *I have reviewed nursing notes, vital signs,  and all appropriate lab and imaging results for this patient.    Lenox Ahr, PA-C 05/01/14 [place] Knapp, MD 05/01/14 [phone]

## 2014-05-01 NOTE — Discharge Instructions (Signed)
Please use salt water gargles several times daily. Please use Chloraseptic Spray before eating, and for times of increased pain. Please stop your erythromycin for now. Use clindamycin with breakfast lunch dinner and at bedtime. Please wash hands frequently. Please increase water, juices, popsicles, Gatorade etc. Please use the mask until symptoms resolve. Pharyngitis Pharyngitis is a sore throat (pharynx). There is redness, pain, and swelling of your throat. HOME CARE   Drink enough fluids to keep your pee (urine) clear or pale yellow.  Only take medicine as told by your doctor.  You may get sick again if you do not take medicine as told. Finish your medicines, even if you start to feel better.  Do not take aspirin.  Rest.  Rinse your mouth (gargle) with salt water ( tsp of salt per 1 qt of water) every 1-2 hours. This will help the pain.  If you are not at risk for choking, you can suck on hard candy or sore throat lozenges. GET HELP IF:  You have large, tender lumps on your neck.  You have a rash.  You cough up green, yellow-brown, or bloody spit. GET HELP RIGHT AWAY IF:   You have a stiff neck.  You drool or cannot swallow liquids.  You throw up (vomit) or are not able to keep medicine or liquids down.  You have very bad pain that does not go away with medicine.  You have problems breathing (not from a stuffy nose). MAKE SURE YOU:   Understand these instructions.  Will watch your condition.  Will get help right away if you are not doing well or get worse. Document Released: 11/09/2007 Document Revised: 03/13/2013 Document Reviewed: 01/28/2013 [facility] Patient Information 2015 [place], [place]. This information is not intended to replace advice given to you by your health care provider. Make sure you discuss any questions you have with your health care provider.

## 2014-05-01 NOTE — ED Notes (Signed)
Sore throat for 3days, Seen by Md and given antibiotic, says he is no better, chills.

## 2014-09-03 IMAGING — CR DG CHEST 2V
2 series · 2 of 2 positions shown · non-contrast
Comparison: 01/21/2009.

CLINICAL DATA: Smoking history.

CHEST - 2 VIEW

[view not recorded (1 of 2)]
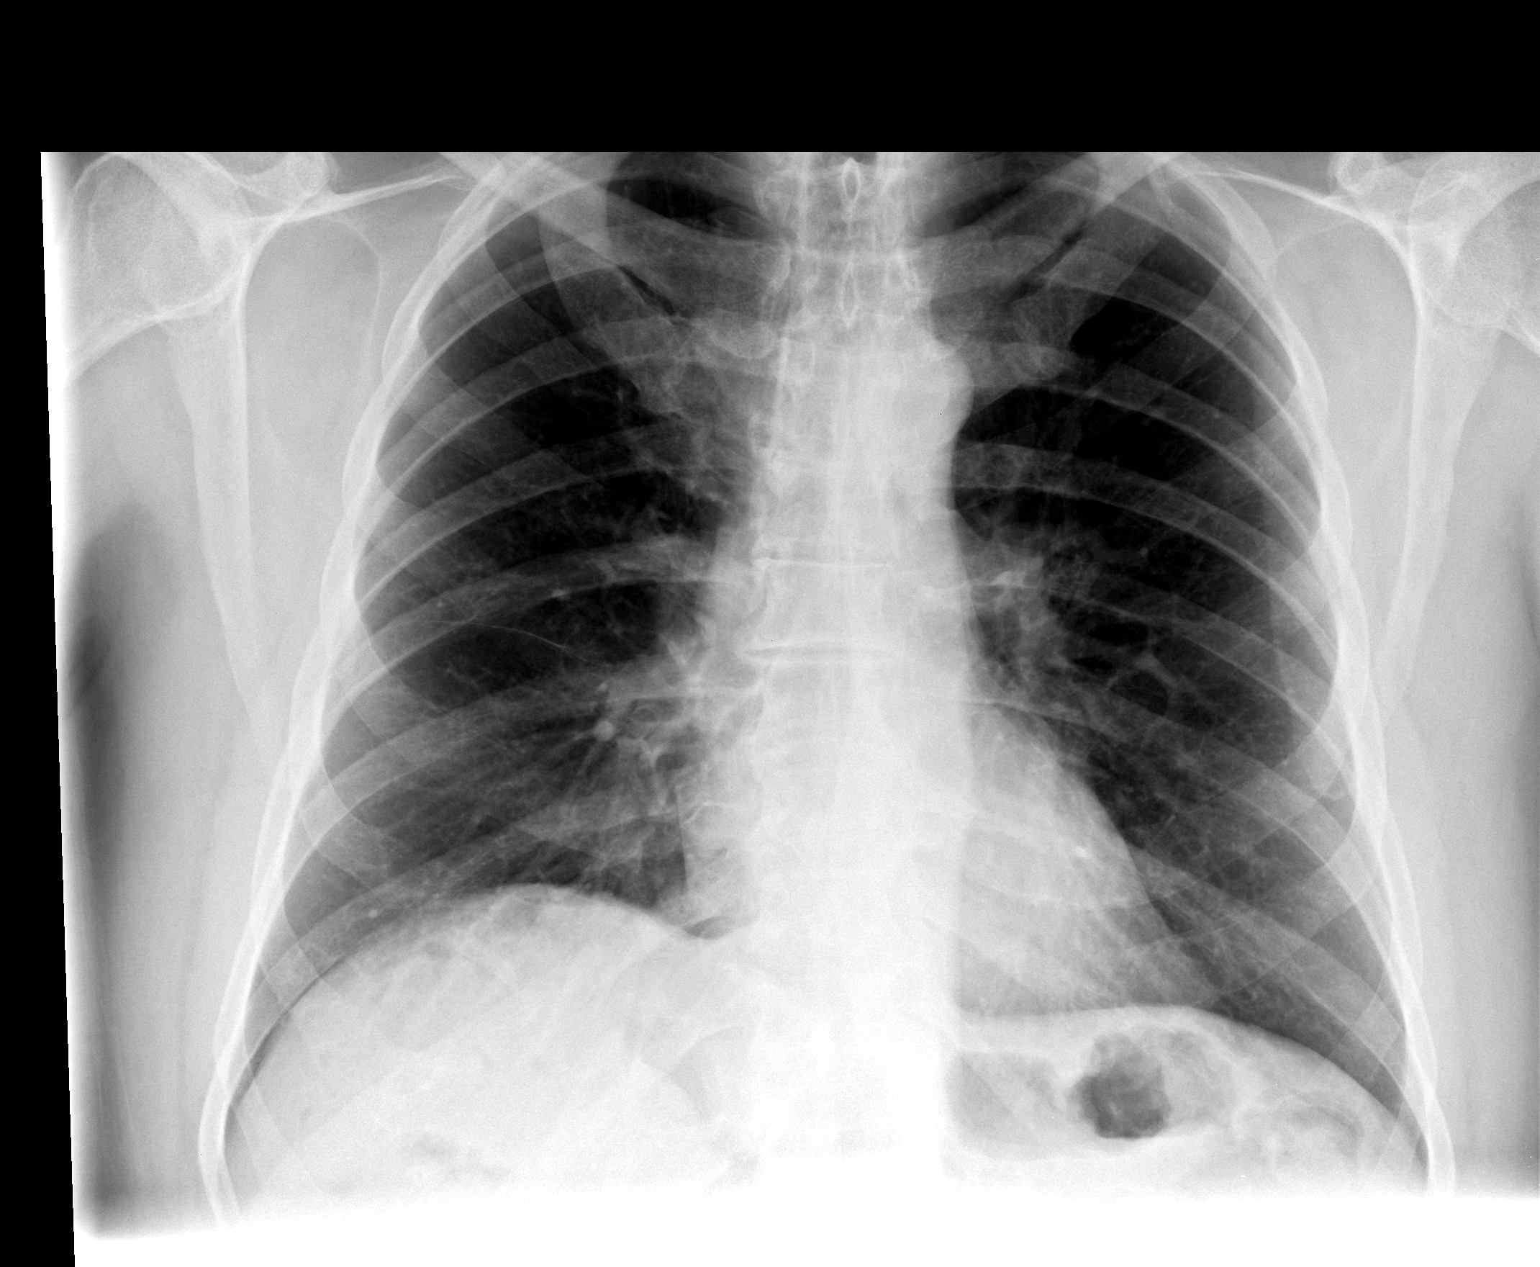

[view not recorded (2 of 2)]
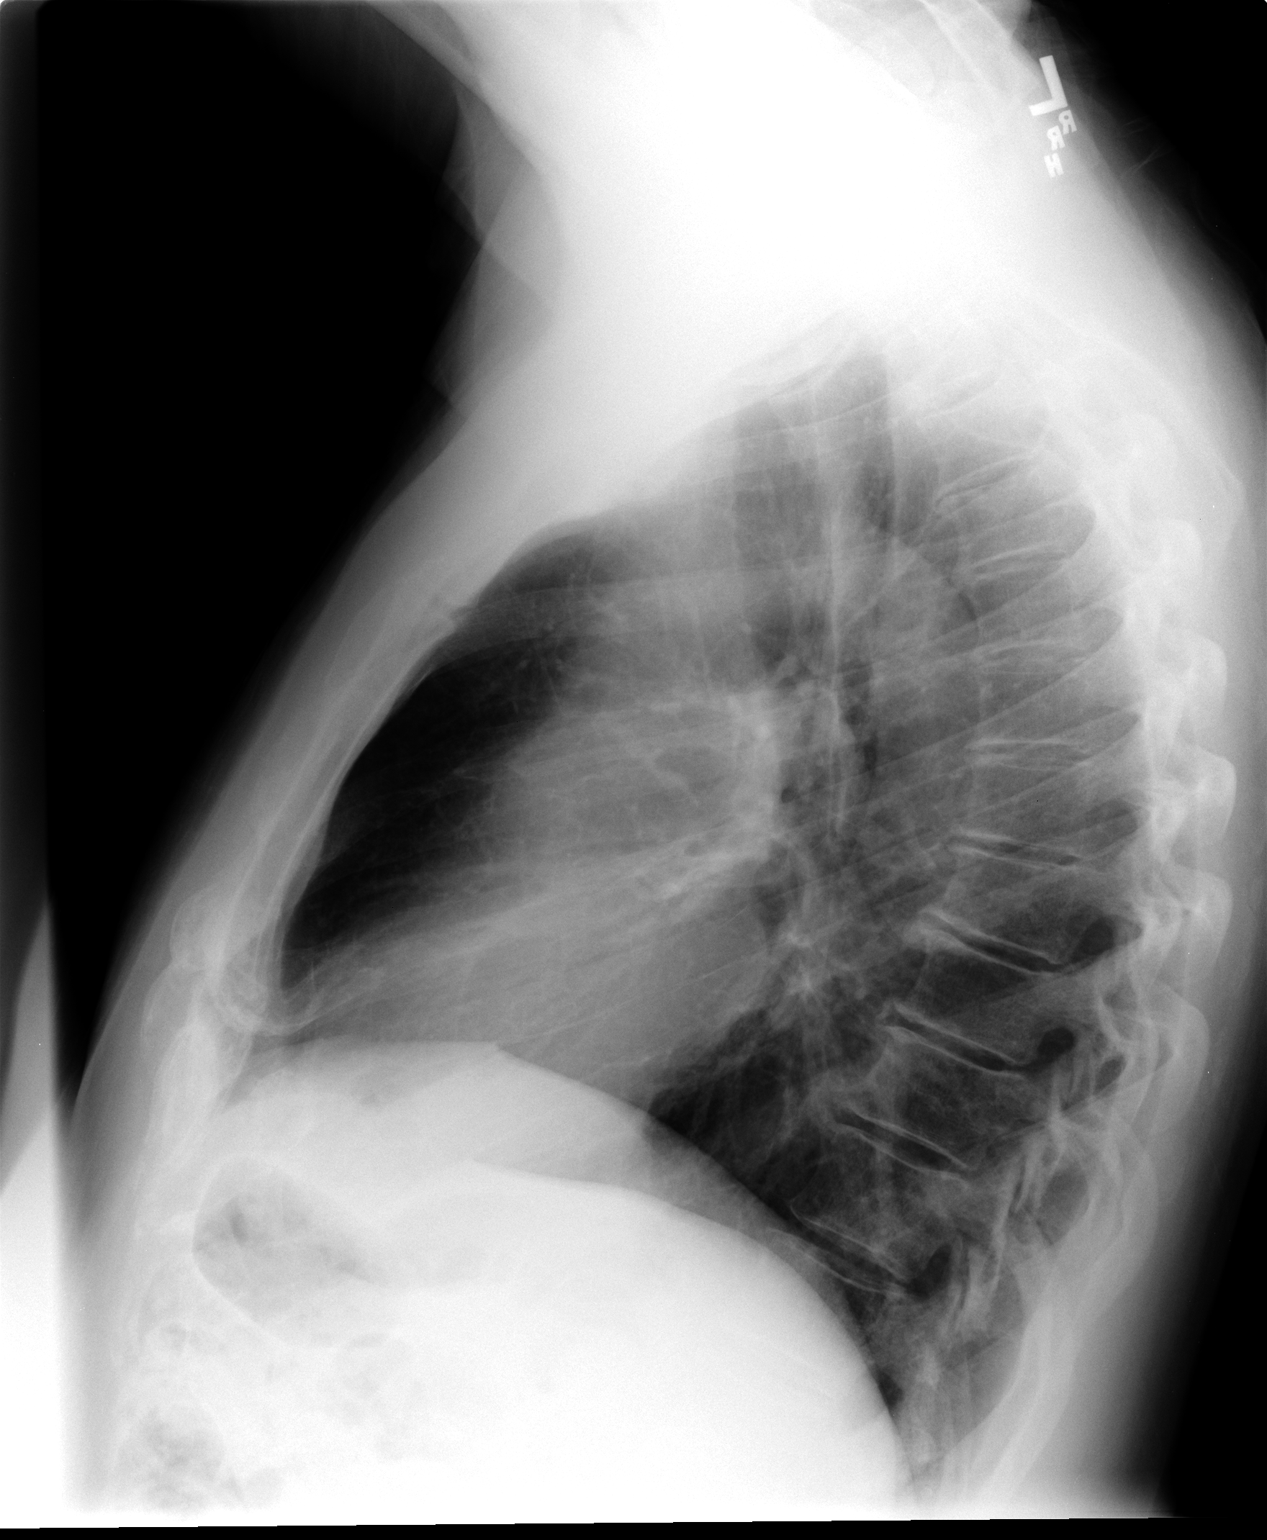

[2 of 2 positions shown; findings below may reference images not displayed]

FINDINGS: The cardiac silhouette, mediastinal and hilar contours
are within normal limits.  The lungs are clear.  No pulmonary
lesions.  No pleural effusion.  The bony thorax is intact.
IMPRESSION: No acute cardiopulmonary findings.

## 2016-09-13 DIAGNOSIS — Z79899 Other long term (current) drug therapy: Secondary | ICD-10-CM | POA: Diagnosis not present

## 2016-09-13 DIAGNOSIS — M545 Low back pain: Secondary | ICD-10-CM | POA: Diagnosis not present

## 2016-09-13 DIAGNOSIS — G4700 Insomnia, unspecified: Secondary | ICD-10-CM | POA: Diagnosis not present

## 2016-09-13 DIAGNOSIS — I10 Essential (primary) hypertension: Secondary | ICD-10-CM | POA: Diagnosis not present

## 2016-09-13 DIAGNOSIS — F419 Anxiety disorder, unspecified: Secondary | ICD-10-CM | POA: Diagnosis not present

## 2016-10-04 DIAGNOSIS — Z961 Presence of intraocular lens: Secondary | ICD-10-CM | POA: Diagnosis not present

## 2016-10-07 DIAGNOSIS — G4700 Insomnia, unspecified: Secondary | ICD-10-CM | POA: Diagnosis not present

## 2016-10-07 DIAGNOSIS — Z79899 Other long term (current) drug therapy: Secondary | ICD-10-CM | POA: Diagnosis not present

## 2016-10-07 DIAGNOSIS — F419 Anxiety disorder, unspecified: Secondary | ICD-10-CM | POA: Diagnosis not present

## 2016-10-07 DIAGNOSIS — M549 Dorsalgia, unspecified: Secondary | ICD-10-CM | POA: Diagnosis not present

## 2016-11-04 DIAGNOSIS — G4700 Insomnia, unspecified: Secondary | ICD-10-CM | POA: Diagnosis not present

## 2016-11-04 DIAGNOSIS — Z79899 Other long term (current) drug therapy: Secondary | ICD-10-CM | POA: Diagnosis not present

## 2016-11-04 DIAGNOSIS — F419 Anxiety disorder, unspecified: Secondary | ICD-10-CM | POA: Diagnosis not present

## 2016-11-04 DIAGNOSIS — M542 Cervicalgia: Secondary | ICD-10-CM | POA: Diagnosis not present

## 2016-11-04 DIAGNOSIS — M549 Dorsalgia, unspecified: Secondary | ICD-10-CM | POA: Diagnosis not present

## 2016-11-14 DIAGNOSIS — G250 Essential tremor: Secondary | ICD-10-CM | POA: Diagnosis not present

## 2016-11-14 DIAGNOSIS — Z79899 Other long term (current) drug therapy: Secondary | ICD-10-CM | POA: Diagnosis not present

## 2016-11-14 DIAGNOSIS — F419 Anxiety disorder, unspecified: Secondary | ICD-10-CM | POA: Diagnosis not present

## 2016-11-14 DIAGNOSIS — G4700 Insomnia, unspecified: Secondary | ICD-10-CM | POA: Diagnosis not present

## 2016-11-21 DIAGNOSIS — G250 Essential tremor: Secondary | ICD-10-CM | POA: Diagnosis not present

## 2016-11-21 DIAGNOSIS — I10 Essential (primary) hypertension: Secondary | ICD-10-CM | POA: Diagnosis not present

## 2016-11-21 DIAGNOSIS — G4700 Insomnia, unspecified: Secondary | ICD-10-CM | POA: Diagnosis not present

## 2016-11-21 DIAGNOSIS — F419 Anxiety disorder, unspecified: Secondary | ICD-10-CM | POA: Diagnosis not present

## 2016-11-21 DIAGNOSIS — G629 Polyneuropathy, unspecified: Secondary | ICD-10-CM | POA: Diagnosis not present

## 2016-12-13 DIAGNOSIS — R251 Tremor, unspecified: Secondary | ICD-10-CM | POA: Diagnosis not present

## 2016-12-13 DIAGNOSIS — G629 Polyneuropathy, unspecified: Secondary | ICD-10-CM | POA: Diagnosis not present

## 2016-12-13 DIAGNOSIS — I10 Essential (primary) hypertension: Secondary | ICD-10-CM | POA: Diagnosis not present

## 2016-12-13 DIAGNOSIS — F419 Anxiety disorder, unspecified: Secondary | ICD-10-CM | POA: Diagnosis not present

## 2016-12-13 DIAGNOSIS — R3915 Urgency of urination: Secondary | ICD-10-CM | POA: Diagnosis not present

## 2016-12-13 DIAGNOSIS — G8929 Other chronic pain: Secondary | ICD-10-CM | POA: Diagnosis not present

## 2016-12-13 DIAGNOSIS — Z981 Arthrodesis status: Secondary | ICD-10-CM | POA: Diagnosis not present

## 2016-12-13 DIAGNOSIS — G4700 Insomnia, unspecified: Secondary | ICD-10-CM | POA: Diagnosis not present

## 2016-12-13 DIAGNOSIS — F339 Major depressive disorder, recurrent, unspecified: Secondary | ICD-10-CM | POA: Diagnosis not present

## 2016-12-21 DIAGNOSIS — R1031 Right lower quadrant pain: Secondary | ICD-10-CM | POA: Diagnosis not present

## 2016-12-27 DIAGNOSIS — N200 Calculus of kidney: Secondary | ICD-10-CM | POA: Diagnosis not present

## 2016-12-27 DIAGNOSIS — R1031 Right lower quadrant pain: Secondary | ICD-10-CM | POA: Diagnosis not present

## 2016-12-27 DIAGNOSIS — R300 Dysuria: Secondary | ICD-10-CM | POA: Diagnosis not present

## 2017-01-03 DIAGNOSIS — Z125 Encounter for screening for malignant neoplasm of prostate: Secondary | ICD-10-CM | POA: Diagnosis not present

## 2017-01-03 DIAGNOSIS — I10 Essential (primary) hypertension: Secondary | ICD-10-CM | POA: Diagnosis not present

## 2017-01-03 DIAGNOSIS — Z1159 Encounter for screening for other viral diseases: Secondary | ICD-10-CM | POA: Diagnosis not present

## 2017-01-06 DIAGNOSIS — F1320 Sedative, hypnotic or anxiolytic dependence, uncomplicated: Secondary | ICD-10-CM | POA: Diagnosis not present

## 2017-01-07 DIAGNOSIS — R251 Tremor, unspecified: Secondary | ICD-10-CM | POA: Diagnosis not present

## 2017-01-07 DIAGNOSIS — I10 Essential (primary) hypertension: Secondary | ICD-10-CM | POA: Diagnosis not present

## 2017-01-07 DIAGNOSIS — G4700 Insomnia, unspecified: Secondary | ICD-10-CM | POA: Diagnosis not present

## 2017-01-07 DIAGNOSIS — G629 Polyneuropathy, unspecified: Secondary | ICD-10-CM | POA: Diagnosis not present

## 2017-01-07 DIAGNOSIS — G8929 Other chronic pain: Secondary | ICD-10-CM | POA: Diagnosis not present

## 2017-01-07 DIAGNOSIS — Z981 Arthrodesis status: Secondary | ICD-10-CM | POA: Diagnosis not present

## 2017-01-07 DIAGNOSIS — R3915 Urgency of urination: Secondary | ICD-10-CM | POA: Diagnosis not present

## 2017-01-07 DIAGNOSIS — F419 Anxiety disorder, unspecified: Secondary | ICD-10-CM | POA: Diagnosis not present

## 2017-01-11 DIAGNOSIS — N200 Calculus of kidney: Secondary | ICD-10-CM | POA: Diagnosis not present

## 2017-01-11 DIAGNOSIS — R1031 Right lower quadrant pain: Secondary | ICD-10-CM | POA: Diagnosis not present

## 2017-01-23 ENCOUNTER — Other Ambulatory Visit: Payer: Self-pay | Admitting: Pharmacy Technician

## 2017-01-23 NOTE — Patient Outreach (Signed)
[facility]) Care Management  01/23/2017  [name] 04-19-1951 [id number]  [place] in regard to patients Losartan Medication adherence. Carly verified the last fill and pick up date of Losartan 25mg  was 01/20/17 for a 30 day supply.  Maud Deed [place], [place] [facility] [phone]

## 2017-02-24 DIAGNOSIS — R3915 Urgency of urination: Secondary | ICD-10-CM | POA: Diagnosis not present

## 2017-02-24 DIAGNOSIS — R251 Tremor, unspecified: Secondary | ICD-10-CM | POA: Diagnosis not present

## 2017-02-24 DIAGNOSIS — F419 Anxiety disorder, unspecified: Secondary | ICD-10-CM | POA: Diagnosis not present

## 2017-02-24 DIAGNOSIS — F339 Major depressive disorder, recurrent, unspecified: Secondary | ICD-10-CM | POA: Diagnosis not present

## 2017-02-24 DIAGNOSIS — G8929 Other chronic pain: Secondary | ICD-10-CM | POA: Diagnosis not present

## 2017-02-24 DIAGNOSIS — G629 Polyneuropathy, unspecified: Secondary | ICD-10-CM | POA: Diagnosis not present

## 2017-02-24 DIAGNOSIS — Z6828 Body mass index (BMI) 28.0-28.9, adult: Secondary | ICD-10-CM | POA: Diagnosis not present

## 2017-02-24 DIAGNOSIS — G4700 Insomnia, unspecified: Secondary | ICD-10-CM | POA: Diagnosis not present

## 2017-02-24 DIAGNOSIS — I10 Essential (primary) hypertension: Secondary | ICD-10-CM | POA: Diagnosis not present

## 2017-02-24 DIAGNOSIS — Z5181 Encounter for therapeutic drug level monitoring: Secondary | ICD-10-CM | POA: Diagnosis not present

## 2017-02-24 DIAGNOSIS — Z23 Encounter for immunization: Secondary | ICD-10-CM | POA: Diagnosis not present

## 2017-03-28 DIAGNOSIS — F418 Other specified anxiety disorders: Secondary | ICD-10-CM | POA: Diagnosis not present

## 2017-04-06 DIAGNOSIS — H04123 Dry eye syndrome of bilateral lacrimal glands: Secondary | ICD-10-CM | POA: Diagnosis not present

## 2017-04-06 DIAGNOSIS — H2512 Age-related nuclear cataract, left eye: Secondary | ICD-10-CM | POA: Diagnosis not present

## 2017-04-06 DIAGNOSIS — Z961 Presence of intraocular lens: Secondary | ICD-10-CM | POA: Diagnosis not present

## 2017-10-18 DIAGNOSIS — Z6838 Body mass index (BMI) 38.0-38.9, adult: Secondary | ICD-10-CM | POA: Diagnosis not present

## 2017-10-18 DIAGNOSIS — F419 Anxiety disorder, unspecified: Secondary | ICD-10-CM | POA: Diagnosis not present

## 2017-10-18 DIAGNOSIS — G4700 Insomnia, unspecified: Secondary | ICD-10-CM | POA: Diagnosis not present

## 2017-10-18 DIAGNOSIS — F331 Major depressive disorder, recurrent, moderate: Secondary | ICD-10-CM | POA: Diagnosis not present

## 2018-02-15 DIAGNOSIS — G629 Polyneuropathy, unspecified: Secondary | ICD-10-CM | POA: Diagnosis not present

## 2018-02-15 DIAGNOSIS — F331 Major depressive disorder, recurrent, moderate: Secondary | ICD-10-CM | POA: Diagnosis not present

## 2018-02-15 DIAGNOSIS — G8929 Other chronic pain: Secondary | ICD-10-CM | POA: Diagnosis not present

## 2018-02-15 DIAGNOSIS — R251 Tremor, unspecified: Secondary | ICD-10-CM | POA: Diagnosis not present

## 2018-02-15 DIAGNOSIS — I10 Essential (primary) hypertension: Secondary | ICD-10-CM | POA: Diagnosis not present

## 2018-02-15 DIAGNOSIS — F419 Anxiety disorder, unspecified: Secondary | ICD-10-CM | POA: Diagnosis not present

## 2018-02-15 DIAGNOSIS — F339 Major depressive disorder, recurrent, unspecified: Secondary | ICD-10-CM | POA: Diagnosis not present

## 2018-02-15 DIAGNOSIS — R3915 Urgency of urination: Secondary | ICD-10-CM | POA: Diagnosis not present

## 2018-02-27 DIAGNOSIS — Z6837 Body mass index (BMI) 37.0-37.9, adult: Secondary | ICD-10-CM | POA: Diagnosis not present

## 2018-02-27 DIAGNOSIS — I10 Essential (primary) hypertension: Secondary | ICD-10-CM | POA: Diagnosis not present

## 2018-02-28 DIAGNOSIS — G8929 Other chronic pain: Secondary | ICD-10-CM | POA: Diagnosis not present

## 2018-02-28 DIAGNOSIS — G629 Polyneuropathy, unspecified: Secondary | ICD-10-CM | POA: Diagnosis not present

## 2018-02-28 DIAGNOSIS — R251 Tremor, unspecified: Secondary | ICD-10-CM | POA: Diagnosis not present

## 2018-02-28 DIAGNOSIS — Z981 Arthrodesis status: Secondary | ICD-10-CM | POA: Diagnosis not present

## 2018-02-28 DIAGNOSIS — Z125 Encounter for screening for malignant neoplasm of prostate: Secondary | ICD-10-CM | POA: Diagnosis not present

## 2018-02-28 DIAGNOSIS — G4700 Insomnia, unspecified: Secondary | ICD-10-CM | POA: Diagnosis not present

## 2018-02-28 DIAGNOSIS — R7301 Impaired fasting glucose: Secondary | ICD-10-CM | POA: Diagnosis not present

## 2018-02-28 DIAGNOSIS — I10 Essential (primary) hypertension: Secondary | ICD-10-CM | POA: Diagnosis not present

## 2018-02-28 DIAGNOSIS — F419 Anxiety disorder, unspecified: Secondary | ICD-10-CM | POA: Diagnosis not present

## 2018-02-28 DIAGNOSIS — F339 Major depressive disorder, recurrent, unspecified: Secondary | ICD-10-CM | POA: Diagnosis not present

## 2018-02-28 DIAGNOSIS — R3915 Urgency of urination: Secondary | ICD-10-CM | POA: Diagnosis not present

## 2018-03-02 DIAGNOSIS — I10 Essential (primary) hypertension: Secondary | ICD-10-CM | POA: Diagnosis not present

## 2018-03-02 DIAGNOSIS — F419 Anxiety disorder, unspecified: Secondary | ICD-10-CM | POA: Diagnosis not present

## 2018-03-02 DIAGNOSIS — F331 Major depressive disorder, recurrent, moderate: Secondary | ICD-10-CM | POA: Diagnosis not present

## 2018-03-02 DIAGNOSIS — Z0000 Encounter for general adult medical examination without abnormal findings: Secondary | ICD-10-CM | POA: Diagnosis not present

## 2018-03-02 DIAGNOSIS — G250 Essential tremor: Secondary | ICD-10-CM | POA: Diagnosis not present

## 2018-03-02 DIAGNOSIS — J302 Other seasonal allergic rhinitis: Secondary | ICD-10-CM | POA: Diagnosis not present

## 2018-03-02 DIAGNOSIS — M542 Cervicalgia: Secondary | ICD-10-CM | POA: Diagnosis not present

## 2018-03-02 DIAGNOSIS — Z6838 Body mass index (BMI) 38.0-38.9, adult: Secondary | ICD-10-CM | POA: Diagnosis not present

## 2018-03-02 DIAGNOSIS — G4700 Insomnia, unspecified: Secondary | ICD-10-CM | POA: Diagnosis not present

## 2018-03-02 DIAGNOSIS — G629 Polyneuropathy, unspecified: Secondary | ICD-10-CM | POA: Diagnosis not present

## 2018-04-25 ENCOUNTER — Other Ambulatory Visit: Payer: Self-pay | Admitting: Neurosurgery

## 2018-05-16 ENCOUNTER — Encounter (HOSPITAL_COMMUNITY): Payer: Self-pay

## 2018-05-16 NOTE — Pre-Procedure Instructions (Signed)
[name]  05/16/2018      [facility] - [place], [address] [name] [address] Phone: [phone] Fax: [phone]    Your procedure is scheduled on Thursday December 19.  Report to [facility] Admitting at 10:00 A.M.  Call this number if you have problems the morning of surgery:  [phone]   Remember:  Do not eat or drink after midnight.    Take these medicines the morning of surgery with A SIP OF WATER:   Propranolol (Inderal) Pregabalin (Lyrica) Alprazolam (Xanax) if needed Oxycodone-acetaminophen (Percocet) if needed  7 days prior to surgery STOP taking any Aspirin(unless otherwise instructed by your surgeon), Aleve, Naproxen, Ibuprofen, Motrin, Advil, Goody's, BC's, all herbal medications, fish oil, and all vitamins     Do not wear jewelry  Do not wear lotions, powders, or colognes, or deodorant.  Do not shave 48 hours prior to surgery.  Men may shave face and neck.  Do not bring valuables to the hospital.  [facility] is not responsible for any belongings or valuables.  Contacts, dentures or bridgework may not be worn into surgery.  Leave your suitcase in the car.  After surgery it may be brought to your room.  For patients admitted to the hospital, discharge time will be determined by your treatment team.  Patients discharged the day of surgery will not be allowed to drive home.   Special instructions:    [facility]- Preparing For Surgery  Before surgery, you can play an important role. Because skin is not sterile, your skin needs to be as free of germs as possible. You can reduce the number of germs on your skin by washing with CHG (chlorahexidine gluconate) Soap before surgery.  CHG is an antiseptic cleaner which kills germs and bonds with the skin to continue killing germs even after washing.    Oral Hygiene is also important to reduce your risk of infection.  Remember - BRUSH YOUR TEETH THE  MORNING OF SURGERY WITH YOUR REGULAR TOOTHPASTE  Please do not use if you have an allergy to CHG or antibacterial soaps. If your skin becomes reddened/irritated stop using the CHG.  Do not shave (including legs and underarms) for at least 48 hours prior to first CHG shower. It is OK to shave your face.  Please follow these instructions carefully.   1. Shower the NIGHT BEFORE SURGERY and the MORNING OF SURGERY with CHG.   2. If you chose to wash your hair, wash your hair first as usual with your normal shampoo.  3. After you shampoo, rinse your hair and body thoroughly to remove the shampoo.  4. Use CHG as you would any other liquid soap. You can apply CHG directly to the skin and wash gently with a scrungie or a clean washcloth.   5. Apply the CHG Soap to your body ONLY FROM THE NECK DOWN.  Do not use on open wounds or open sores. Avoid contact with your eyes, ears, mouth and genitals (private parts). Wash Face and genitals (private parts)  with your normal soap.  6. Wash thoroughly, paying special attention to the area where your surgery will be performed.  7. Thoroughly rinse your body with warm water from the neck down.  8. DO NOT shower/wash with your normal soap after using and rinsing off the CHG Soap.  9. Pat yourself dry with a CLEAN TOWEL.  10. Wear CLEAN PAJAMAS to bed the  night before surgery, wear comfortable clothes the morning of surgery  11. Place CLEAN SHEETS on your bed the night of your first shower and DO NOT SLEEP WITH PETS.    Day of Surgery:  Do not apply any deodorants/lotions.  Please wear clean clothes to the hospital/surgery center.   Remember to brush your teeth WITH YOUR REGULAR TOOTHPASTE.    Please read over the following fact sheets that you were given. Coughing and Deep Breathing, MRSA Information and Surgical Site Infection Prevention

## 2018-05-17 ENCOUNTER — Other Ambulatory Visit: Payer: Self-pay

## 2018-05-17 ENCOUNTER — Encounter (HOSPITAL_COMMUNITY)
Admission: RE | Admit: 2018-05-17 | Discharge: 2018-05-17 | Disposition: A | Discharge status: Home / Self Care | Payer: No Typology Code available for payment source | Source: Ambulatory Visit | Attending: Neurosurgery | Admitting: Neurosurgery

## 2018-05-17 ENCOUNTER — Encounter (HOSPITAL_COMMUNITY): Payer: Self-pay

## 2018-05-17 DIAGNOSIS — I10 Essential (primary) hypertension: Secondary | ICD-10-CM | POA: Diagnosis not present

## 2018-05-17 DIAGNOSIS — Z01818 Encounter for other preprocedural examination: Secondary | ICD-10-CM | POA: Insufficient documentation

## 2018-05-17 DIAGNOSIS — M4712 Other spondylosis with myelopathy, cervical region: Secondary | ICD-10-CM | POA: Diagnosis not present

## 2018-05-17 DIAGNOSIS — M4722 Other spondylosis with radiculopathy, cervical region: Secondary | ICD-10-CM | POA: Insufficient documentation

## 2018-05-17 DIAGNOSIS — R001 Bradycardia, unspecified: Secondary | ICD-10-CM | POA: Insufficient documentation

## 2018-05-17 HISTORY — DX: Palpitations: R00.2

## 2018-05-17 LAB — BASIC METABOLIC PANEL
Anion gap: 8 (ref 5–15)
BUN: 10 mg/dL (ref 8–23)
CO2: 31 mmol/L (ref 22–32)
Calcium: 9.4 mg/dL (ref 8.9–10.3)
Chloride: 103 mmol/L (ref 98–111)
Creatinine, Ser: 1.09 mg/dL (ref 0.61–1.24)
GFR calc Af Amer: >60 mL/min (ref >60)
GFR calc non Af Amer: >60 mL/min (ref >60)
Glucose, Bld: 82 mg/dL (ref 70–99)
Potassium: 4.6 mmol/L (ref 3.5–5.1)
Sodium: 142 mmol/L (ref 135–145)

## 2018-05-17 LAB — ABO/RH: ABO/RH(D): A NEG

## 2018-05-17 LAB — CBC
HCT: 45.7 % (ref 39.0–52.0)
Hemoglobin: 14.3 g/dL (ref 13.0–17.0)
MCH: 28.3 pg (ref 26.0–34.0)
MCHC: 31.3 g/dL (ref 30.0–36.0)
MCV: 90.5 fL (ref 80.0–100.0)
Platelets: 184 10*3/uL (ref 150–400)
RBC: 5.05 MIL/uL (ref 4.22–5.81)
RDW: 11.9 % (ref 11.5–15.5)
WBC: 6.3 10*3/uL (ref 4.0–10.5)
nRBC: 0 % (ref 0.0–0.2)

## 2018-05-17 LAB — TYPE AND SCREEN
ABO/RH(D): A NEG
Antibody Screen: NEGATIVE

## 2018-05-17 LAB — SURGICAL PCR SCREEN
MRSA, PCR: NEGATIVE
Staphylococcus aureus: NEGATIVE

## 2018-05-17 NOTE — Progress Notes (Signed)
PCP - Delphina Cahill Cardiologist - denies  EKG - 05/17/2018   Patient denies shortness of breath, fever, cough and chest pain at PAT appointment   Patient verbalized understanding of instructions that were given to them at the PAT appointment. Patient was also instructed that they will need to review over the PAT instructions again at home before surgery.

## 2018-05-24 ENCOUNTER — Ambulatory Visit (HOSPITAL_COMMUNITY): Payer: No Typology Code available for payment source | Admitting: Anesthesiology

## 2018-05-24 ENCOUNTER — Observation Stay (HOSPITAL_COMMUNITY)
Admission: RE | Admit: 2018-05-24 | Discharge: 2018-05-25 | Disposition: A | Discharge status: Home / Self Care | Payer: No Typology Code available for payment source | Attending: Neurosurgery | Admitting: Neurosurgery

## 2018-05-24 ENCOUNTER — Encounter (HOSPITAL_COMMUNITY)
Admission: RE | Disposition: A | Discharge status: Home / Self Care | Payer: Self-pay | Source: Home / Self Care | Attending: Neurosurgery

## 2018-05-24 ENCOUNTER — Other Ambulatory Visit: Payer: Self-pay

## 2018-05-24 ENCOUNTER — Ambulatory Visit (HOSPITAL_COMMUNITY): Payer: No Typology Code available for payment source

## 2018-05-24 ENCOUNTER — Encounter (HOSPITAL_COMMUNITY): Payer: Self-pay | Admitting: Anesthesiology

## 2018-05-24 DIAGNOSIS — Z419 Encounter for procedure for purposes other than remedying health state, unspecified: Secondary | ICD-10-CM

## 2018-05-24 DIAGNOSIS — I10 Essential (primary) hypertension: Secondary | ICD-10-CM | POA: Diagnosis not present

## 2018-05-24 DIAGNOSIS — N400 Enlarged prostate without lower urinary tract symptoms: Secondary | ICD-10-CM | POA: Insufficient documentation

## 2018-05-24 DIAGNOSIS — M1990 Unspecified osteoarthritis, unspecified site: Secondary | ICD-10-CM | POA: Diagnosis not present

## 2018-05-24 DIAGNOSIS — F419 Anxiety disorder, unspecified: Secondary | ICD-10-CM | POA: Insufficient documentation

## 2018-05-24 DIAGNOSIS — Z79899 Other long term (current) drug therapy: Secondary | ICD-10-CM | POA: Diagnosis not present

## 2018-05-24 DIAGNOSIS — M4802 Spinal stenosis, cervical region: Principal | ICD-10-CM | POA: Insufficient documentation

## 2018-05-24 DIAGNOSIS — M4722 Other spondylosis with radiculopathy, cervical region: Secondary | ICD-10-CM | POA: Diagnosis present

## 2018-05-24 DIAGNOSIS — K219 Gastro-esophageal reflux disease without esophagitis: Secondary | ICD-10-CM | POA: Insufficient documentation

## 2018-05-24 DIAGNOSIS — Z87891 Personal history of nicotine dependence: Secondary | ICD-10-CM | POA: Insufficient documentation

## 2018-05-24 HISTORY — PX: ANTERIOR CERVICAL DECOMP/DISCECTOMY FUSION: SHX1161

## 2018-05-24 SURGERY — ANTERIOR CERVICAL DECOMPRESSION/DISCECTOMY FUSION 1 LEVEL
Anesthesia: General | Site: Spine Cervical

## 2018-05-24 MED ORDER — LOSARTAN POTASSIUM 50 MG PO TABS
25.0000 mg | ORAL_TABLET | Freq: Every day | ORAL | Status: DC
  Administered 2018-05-25: 25 mg via ORAL
  Filled 2018-05-24: qty 1

## 2018-05-24 MED ORDER — BISACODYL 10 MG RE SUPP: 10.0000 mg | Freq: Every day | RECTAL | Status: DC | PRN

## 2018-05-24 MED ORDER — BUPIVACAINE-EPINEPHRINE (PF) 0.25% -1:200000 IJ SOLN
INTRAMUSCULAR | Status: AC
  Filled 2018-05-24: qty 30

## 2018-05-24 MED ORDER — PROMETHAZINE HCL 25 MG/ML IJ SOLN: 6.2500 mg | INTRAMUSCULAR | Status: DC | PRN

## 2018-05-24 MED ORDER — ACETAMINOPHEN 500 MG PO TABS
1000.0000 mg | ORAL_TABLET | Freq: Four times a day (QID) | ORAL | Status: DC
  Administered 2018-05-24 – 2018-05-25 (×3): 1000 mg via ORAL
  Filled 2018-05-24 (×3): qty 2

## 2018-05-24 MED ORDER — ONDANSETRON HCL 4 MG/2ML IJ SOLN
INTRAMUSCULAR | Status: AC
  Filled 2018-05-24: qty 2

## 2018-05-24 MED ORDER — VANCOMYCIN HCL IN DEXTROSE 1-5 GM/200ML-% IV SOLN
1000.0000 mg | INTRAVENOUS | Status: AC
  Administered 2018-05-24: 1000 mg via INTRAVENOUS

## 2018-05-24 MED ORDER — CHLORHEXIDINE GLUCONATE CLOTH 2 % EX PADS: 6.0000 | MEDICATED_PAD | Freq: Once | CUTANEOUS | Status: DC

## 2018-05-24 MED ORDER — LACTATED RINGERS IV SOLN
INTRAVENOUS | Status: DC
  Administered 2018-05-24: 10:00:00 via INTRAVENOUS

## 2018-05-24 MED ORDER — PROPRANOLOL HCL 20 MG PO TABS
20.0000 mg | ORAL_TABLET | Freq: Two times a day (BID) | ORAL | Status: DC
  Administered 2018-05-24 – 2018-05-25 (×2): 20 mg via ORAL
  Filled 2018-05-24 (×2): qty 1

## 2018-05-24 MED ORDER — EPHEDRINE SULFATE-NACL 50-0.9 MG/10ML-% IV SOSY
PREFILLED_SYRINGE | INTRAVENOUS | Status: DC | PRN
  Administered 2018-05-24: 10 mg via INTRAVENOUS

## 2018-05-24 MED ORDER — ALUM & MAG HYDROXIDE-SIMETH 200-200-20 MG/5ML PO SUSP: 30.0000 mL | Freq: Four times a day (QID) | ORAL | Status: DC | PRN

## 2018-05-24 MED ORDER — DEXAMETHASONE SODIUM PHOSPHATE 4 MG/ML IJ SOLN: 4.0000 mg | Freq: Four times a day (QID) | INTRAMUSCULAR | Status: AC

## 2018-05-24 MED ORDER — SODIUM CHLORIDE 0.9 % IV SOLN
INTRAVENOUS | Status: DC | PRN
  Administered 2018-05-24: 500 mL

## 2018-05-24 MED ORDER — PROPOFOL 10 MG/ML IV BOLUS
INTRAVENOUS | Status: DC | PRN
  Administered 2018-05-24: 150 mg via INTRAVENOUS

## 2018-05-24 MED ORDER — BUPIVACAINE-EPINEPHRINE (PF) 0.25% -1:200000 IJ SOLN
INTRAMUSCULAR | Status: DC | PRN
  Administered 2018-05-24: 10 mL

## 2018-05-24 MED ORDER — ONDANSETRON HCL 4 MG PO TABS: 4.0000 mg | ORAL_TABLET | Freq: Four times a day (QID) | ORAL | Status: DC | PRN

## 2018-05-24 MED ORDER — SCOPOLAMINE 1 MG/3DAYS TD PT72
MEDICATED_PATCH | TRANSDERMAL | Status: AC
  Filled 2018-05-24: qty 1

## 2018-05-24 MED ORDER — VANCOMYCIN HCL IN DEXTROSE 1-5 GM/200ML-% IV SOLN
INTRAVENOUS | Status: AC
  Administered 2018-05-24: 1000 mg via INTRAVENOUS
  Filled 2018-05-24: qty 200

## 2018-05-24 MED ORDER — MIDAZOLAM HCL 2 MG/2ML IJ SOLN: 0.5000 mg | Freq: Once | INTRAMUSCULAR | Status: DC | PRN

## 2018-05-24 MED ORDER — FENTANYL CITRATE (PF) 100 MCG/2ML IJ SOLN
INTRAMUSCULAR | Status: DC | PRN
  Administered 2018-05-24 (×2): 50 ug via INTRAVENOUS
  Administered 2018-05-24: 100 ug via INTRAVENOUS
  Administered 2018-05-24 (×3): 50 ug via INTRAVENOUS

## 2018-05-24 MED ORDER — LIDOCAINE 2% (20 MG/ML) 5 ML SYRINGE
INTRAMUSCULAR | Status: DC | PRN
  Administered 2018-05-24: 80 mg via INTRAVENOUS

## 2018-05-24 MED ORDER — OXYCODONE HCL 5 MG PO TABS: 5.0000 mg | ORAL_TABLET | ORAL | Status: DC | PRN

## 2018-05-24 MED ORDER — SCOPOLAMINE 1 MG/3DAYS TD PT72
1.0000 | MEDICATED_PATCH | Freq: Once | TRANSDERMAL | Status: DC
  Administered 2018-05-24: 1.5 mg via TRANSDERMAL

## 2018-05-24 MED ORDER — MENTHOL 3 MG MT LOZG
1.0000 | LOZENGE | OROMUCOSAL | Status: DC | PRN
  Filled 2018-05-24: qty 9

## 2018-05-24 MED ORDER — ROCURONIUM BROMIDE 50 MG/5ML IV SOSY
PREFILLED_SYRINGE | INTRAVENOUS | Status: DC | PRN
  Administered 2018-05-24: 50 mg via INTRAVENOUS

## 2018-05-24 MED ORDER — PHENOL 1.4 % MT LIQD: 1.0000 | OROMUCOSAL | Status: DC | PRN

## 2018-05-24 MED ORDER — VANCOMYCIN HCL IN DEXTROSE 1-5 GM/200ML-% IV SOLN
1000.0000 mg | Freq: Once | INTRAVENOUS | Status: AC
  Administered 2018-05-24: 1000 mg via INTRAVENOUS
  Filled 2018-05-24: qty 200

## 2018-05-24 MED ORDER — DEXAMETHASONE SODIUM PHOSPHATE 10 MG/ML IJ SOLN
INTRAMUSCULAR | Status: AC
  Filled 2018-05-24: qty 1

## 2018-05-24 MED ORDER — MIDAZOLAM HCL 2 MG/2ML IJ SOLN
INTRAMUSCULAR | Status: AC
  Filled 2018-05-24: qty 2

## 2018-05-24 MED ORDER — SODIUM CHLORIDE 0.9 % IV SOLN
INTRAVENOUS | Status: DC | PRN
  Administered 2018-05-24: 20 ug/min via INTRAVENOUS

## 2018-05-24 MED ORDER — HYDROMORPHONE HCL 1 MG/ML IJ SOLN
INTRAMUSCULAR | Status: AC
  Filled 2018-05-24: qty 1

## 2018-05-24 MED ORDER — SUGAMMADEX SODIUM 200 MG/2ML IV SOLN
INTRAVENOUS | Status: DC | PRN
  Administered 2018-05-24: 200 mg via INTRAVENOUS

## 2018-05-24 MED ORDER — DOCUSATE SODIUM 100 MG PO CAPS
100.0000 mg | ORAL_CAPSULE | Freq: Two times a day (BID) | ORAL | Status: DC
  Administered 2018-05-24 – 2018-05-25 (×2): 100 mg via ORAL
  Filled 2018-05-24 (×2): qty 1

## 2018-05-24 MED ORDER — FENTANYL CITRATE (PF) 250 MCG/5ML IJ SOLN
INTRAMUSCULAR | Status: AC
  Filled 2018-05-24: qty 5

## 2018-05-24 MED ORDER — DEXAMETHASONE 4 MG PO TABS
4.0000 mg | ORAL_TABLET | Freq: Four times a day (QID) | ORAL | Status: AC
  Administered 2018-05-24 (×2): 4 mg via ORAL
  Filled 2018-05-24 (×2): qty 1

## 2018-05-24 MED ORDER — LACTULOSE 10 GM/15ML PO SOLN
10.0000 g | Freq: Every evening | ORAL | Status: DC | PRN
  Filled 2018-05-24: qty 15

## 2018-05-24 MED ORDER — PROPOFOL 10 MG/ML IV BOLUS
INTRAVENOUS | Status: AC
  Filled 2018-05-24: qty 20

## 2018-05-24 MED ORDER — ONDANSETRON HCL 4 MG/2ML IJ SOLN
INTRAMUSCULAR | Status: DC | PRN
  Administered 2018-05-24: 4 mg via INTRAVENOUS

## 2018-05-24 MED ORDER — ROCURONIUM BROMIDE 50 MG/5ML IV SOSY
PREFILLED_SYRINGE | INTRAVENOUS | Status: AC
  Filled 2018-05-24: qty 5

## 2018-05-24 MED ORDER — THROMBIN 5000 UNITS EX SOLR
CUTANEOUS | Status: AC
  Filled 2018-05-24: qty 5000

## 2018-05-24 MED ORDER — LINACLOTIDE 145 MCG PO CAPS
290.0000 ug | ORAL_CAPSULE | Freq: Every day | ORAL | Status: DC
  Administered 2018-05-25: 290 ug via ORAL
  Filled 2018-05-24: qty 2

## 2018-05-24 MED ORDER — LACTATED RINGERS IV SOLN: INTRAVENOUS | Status: DC

## 2018-05-24 MED ORDER — BACITRACIN ZINC 500 UNIT/GM EX OINT
TOPICAL_OINTMENT | CUTANEOUS | Status: DC | PRN
  Administered 2018-05-24: 1 via TOPICAL

## 2018-05-24 MED ORDER — MIDAZOLAM HCL 5 MG/5ML IJ SOLN
INTRAMUSCULAR | Status: DC | PRN
  Administered 2018-05-24: 2 mg via INTRAVENOUS

## 2018-05-24 MED ORDER — ONDANSETRON HCL 4 MG/2ML IJ SOLN: 4.0000 mg | Freq: Four times a day (QID) | INTRAMUSCULAR | Status: DC | PRN

## 2018-05-24 MED ORDER — OXYCODONE-ACETAMINOPHEN 10-325 MG PO TABS: 1.0000 | ORAL_TABLET | ORAL | Status: DC | PRN

## 2018-05-24 MED ORDER — ACETAMINOPHEN 650 MG RE SUPP: 650.0000 mg | RECTAL | Status: DC | PRN

## 2018-05-24 MED ORDER — PREGABALIN 75 MG PO CAPS
75.0000 mg | ORAL_CAPSULE | Freq: Two times a day (BID) | ORAL | Status: DC
  Administered 2018-05-24 – 2018-05-25 (×2): 75 mg via ORAL
  Filled 2018-05-24 (×2): qty 1

## 2018-05-24 MED ORDER — MEPERIDINE HCL 50 MG/ML IJ SOLN: 6.2500 mg | INTRAMUSCULAR | Status: DC | PRN

## 2018-05-24 MED ORDER — ALFUZOSIN HCL ER 10 MG PO TB24
10.0000 mg | ORAL_TABLET | Freq: Every day | ORAL | Status: DC
  Administered 2018-05-24: 10 mg via ORAL
  Filled 2018-05-24: qty 1

## 2018-05-24 MED ORDER — OXYCODONE HCL 5 MG PO TABS
10.0000 mg | ORAL_TABLET | ORAL | Status: DC | PRN
  Administered 2018-05-24 – 2018-05-25 (×6): 10 mg via ORAL
  Filled 2018-05-24 (×6): qty 2

## 2018-05-24 MED ORDER — BACITRACIN ZINC 500 UNIT/GM EX OINT
TOPICAL_OINTMENT | CUTANEOUS | Status: AC
  Filled 2018-05-24: qty 28.35

## 2018-05-24 MED ORDER — LIDOCAINE 2% (20 MG/ML) 5 ML SYRINGE
INTRAMUSCULAR | Status: AC
  Filled 2018-05-24: qty 5

## 2018-05-24 MED ORDER — MORPHINE SULFATE (PF) 4 MG/ML IV SOLN: 4.0000 mg | INTRAVENOUS | Status: DC | PRN

## 2018-05-24 MED ORDER — HYDROMORPHONE HCL 1 MG/ML IJ SOLN
0.2500 mg | INTRAMUSCULAR | Status: DC | PRN
  Administered 2018-05-24 (×4): 0.5 mg via INTRAVENOUS

## 2018-05-24 MED ORDER — PANTOPRAZOLE SODIUM 40 MG IV SOLR
40.0000 mg | Freq: Every day | INTRAVENOUS | Status: DC
  Administered 2018-05-24: 40 mg via INTRAVENOUS
  Filled 2018-05-24: qty 40

## 2018-05-24 MED ORDER — DEXAMETHASONE SODIUM PHOSPHATE 10 MG/ML IJ SOLN
INTRAMUSCULAR | Status: DC | PRN
  Administered 2018-05-24: 10 mg via INTRAVENOUS

## 2018-05-24 MED ORDER — 0.9 % SODIUM CHLORIDE (POUR BTL) OPTIME
TOPICAL | Status: DC | PRN
  Administered 2018-05-24: 1000 mL

## 2018-05-24 MED ORDER — CYCLOBENZAPRINE HCL 10 MG PO TABS
10.0000 mg | ORAL_TABLET | Freq: Three times a day (TID) | ORAL | Status: DC | PRN
  Administered 2018-05-24 – 2018-05-25 (×2): 10 mg via ORAL
  Filled 2018-05-24 (×2): qty 1

## 2018-05-24 MED ORDER — ALPRAZOLAM 0.5 MG PO TABS
0.5000 mg | ORAL_TABLET | Freq: Two times a day (BID) | ORAL | Status: DC | PRN
  Administered 2018-05-25: 0.5 mg via ORAL
  Filled 2018-05-24: qty 1

## 2018-05-24 MED ORDER — ACETAMINOPHEN 325 MG PO TABS: 650.0000 mg | ORAL_TABLET | ORAL | Status: DC | PRN

## 2018-05-24 MED ORDER — THROMBIN 5000 UNITS EX SOLR
OROMUCOSAL | Status: DC | PRN
  Administered 2018-05-24: 5 mL via TOPICAL

## 2018-05-24 SURGICAL SUPPLY — 63 items
APL SKNCLS STERI-STRIP NONHPOA (GAUZE/BANDAGES/DRESSINGS) ×1
BAG DECANTER FOR FLEXI CONT (MISCELLANEOUS) ×2 IMPLANT
BENZOIN TINCTURE PRP APPL 2/3 (GAUZE/BANDAGES/DRESSINGS) ×2 IMPLANT
BIT DRILL NEURO 2X3.1 SFT TUCH (MISCELLANEOUS) ×1 IMPLANT
BLADE SURG 15 STRL LF DISP TIS (BLADE) ×1 IMPLANT
BLADE SURG 15 STRL SS (BLADE)
BLADE ULTRA TIP 2M (BLADE) ×2 IMPLANT
BUR BARREL STRAIGHT FLUTE 4.0 (BURR) ×2 IMPLANT
BUR MATCHSTICK NEURO 3.0 LAGG (BURR) ×2 IMPLANT
CANISTER SUCT 3000ML PPV (MISCELLANEOUS) ×2 IMPLANT
CARTRIDGE OIL MAESTRO DRILL (MISCELLANEOUS) ×1 IMPLANT
COVER MAYO STAND STRL (DRAPES) ×2 IMPLANT
COVER WAND RF STERILE (DRAPES) ×1 IMPLANT
DECANTER SPIKE VIAL GLASS SM (MISCELLANEOUS) ×1 IMPLANT
DIFFUSER DRILL AIR PNEUMATIC (MISCELLANEOUS) ×2 IMPLANT
DRAPE LAPAROTOMY 100X72 PEDS (DRAPES) ×2 IMPLANT
DRAPE MICROSCOPE LEICA (MISCELLANEOUS) IMPLANT
DRAPE POUCH INSTRU U-SHP 10X18 (DRAPES) ×1 IMPLANT
DRAPE SURG 17X23 STRL (DRAPES) ×4 IMPLANT
DRILL NEURO 2X3.1 SOFT TOUCH (MISCELLANEOUS) ×2
DRSG OPSITE POSTOP 4X6 (GAUZE/BANDAGES/DRESSINGS) ×1 IMPLANT
ELECT REM PT RETURN 9FT ADLT (ELECTROSURGICAL) ×2
ELECTRODE REM PT RTRN 9FT ADLT (ELECTROSURGICAL) ×1 IMPLANT
GAUZE 4X4 16PLY RFD (DISPOSABLE) IMPLANT
GAUZE SPONGE 4X4 12PLY STRL (GAUZE/BANDAGES/DRESSINGS) ×1 IMPLANT
GLOVE BIO SURGEON STRL SZ 6.5 (GLOVE) ×1 IMPLANT
GLOVE BIO SURGEON STRL SZ8 (GLOVE) ×2 IMPLANT
GLOVE BIO SURGEON STRL SZ8.5 (GLOVE) ×2 IMPLANT
GLOVE BIOGEL PI IND STRL 6.5 (GLOVE) IMPLANT
GLOVE BIOGEL PI IND STRL 7.0 (GLOVE) IMPLANT
GLOVE BIOGEL PI INDICATOR 6.5 (GLOVE) ×1
GLOVE BIOGEL PI INDICATOR 7.0 (GLOVE) ×1
GLOVE EXAM NITRILE XL STR (GLOVE) IMPLANT
GLOVE SURG SS PI 7.5 STRL IVOR (GLOVE) ×4 IMPLANT
GOWN STRL REUS W/ TWL LRG LVL3 (GOWN DISPOSABLE) IMPLANT
GOWN STRL REUS W/ TWL XL LVL3 (GOWN DISPOSABLE) ×1 IMPLANT
GOWN STRL REUS W/TWL LRG LVL3 (GOWN DISPOSABLE) ×4
GOWN STRL REUS W/TWL XL LVL3 (GOWN DISPOSABLE) ×2
HEMOSTAT POWDER KIT SURGIFOAM (HEMOSTASIS) ×2 IMPLANT
KIT BASIN OR (CUSTOM PROCEDURE TRAY) ×2 IMPLANT
KIT TURNOVER KIT B (KITS) ×2 IMPLANT
MARKER SKIN DUAL TIP RULER LAB (MISCELLANEOUS) ×2 IMPLANT
NDL SPNL 18GX3.5 QUINCKE PK (NEEDLE) ×1 IMPLANT
NEEDLE HYPO 22GX1.5 SAFETY (NEEDLE) ×2 IMPLANT
NEEDLE SPNL 18GX3.5 QUINCKE PK (NEEDLE) ×2 IMPLANT
NS IRRIG 1000ML POUR BTL (IV SOLUTION) ×2 IMPLANT
OIL CARTRIDGE MAESTRO DRILL (MISCELLANEOUS) ×2
PACK LAMINECTOMY NEURO (CUSTOM PROCEDURE TRAY) ×2 IMPLANT
PIN DISTRACTION 14MM (PIN) ×4 IMPLANT
PLATE SKYLINE 12MM (Plate) ×1 IMPLANT
PUTTY FIBERGRAFT BG 1CC (Putty) ×1 IMPLANT
RUBBERBAND STERILE (MISCELLANEOUS) IMPLANT
SCREW VAR SELF TAP SKYLINE 14M (Screw) ×4 IMPLANT
SPACER CIF 8 4D SM (Spacer) ×1 IMPLANT
SPONGE INTESTINAL PEANUT (DISPOSABLE) ×3 IMPLANT
SPONGE SURGIFOAM ABS GEL SZ50 (HEMOSTASIS) IMPLANT
STRIP CLOSURE SKIN 1/2X4 (GAUZE/BANDAGES/DRESSINGS) ×2 IMPLANT
SUT VIC AB 0 CT1 27 (SUTURE) ×2
SUT VIC AB 0 CT1 27XBRD ANTBC (SUTURE) ×1 IMPLANT
SUT VIC AB 3-0 SH 8-18 (SUTURE) ×3 IMPLANT
TOWEL GREEN STERILE (TOWEL DISPOSABLE) ×2 IMPLANT
TOWEL GREEN STERILE FF (TOWEL DISPOSABLE) ×2 IMPLANT
WATER STERILE IRR 1000ML POUR (IV SOLUTION) ×2 IMPLANT

## 2018-05-24 NOTE — Anesthesia Procedure Notes (Signed)
Procedure Name: Intubation Date/Time: 05/24/2018 12:07 PM Performed by: Kyung Rudd, CRNA Pre-anesthesia Checklist: Patient identified, Emergency Drugs available, Suction available and Patient being monitored Patient Re-evaluated:Patient Re-evaluated prior to induction Oxygen Delivery Method: Circle system utilized Preoxygenation: Pre-oxygenation with 100% oxygen Induction Type: IV induction Ventilation: Mask ventilation without difficulty Laryngoscope Size: Glidescope and 3 Tube type: Oral Tube size: 7.5 mm Number of attempts: 1 Airway Equipment and Method: Stylet Placement Confirmation: ETT inserted through vocal cords under direct vision,  positive ETCO2 and breath sounds checked- equal and bilateral Secured at: 21 cm Tube secured with: Tape Dental Injury: Teeth and Oropharynx as per pre-operative assessment  Comments: Electively used Glidescope per Dr. Glennon Mac request. Grade I view on screen. Maintained neck in neutral cervical alignment.

## 2018-05-24 NOTE — Anesthesia Preprocedure Evaluation (Addendum)
Anesthesia Evaluation  Patient identified by MRN, date of birth, ID band Patient awake    Reviewed: Allergy & Precautions, NPO status , Patient's Chart, lab work & pertinent test results, reviewed documented beta blocker date and time   History of Anesthesia Complications (+) PONV  Airway Mallampati: I  TM Distance: >3 FB Neck ROM: Full    Dental  (+) Caps, Dental Advisory Given   Pulmonary former smoker (quit 1995),    breath sounds clear to auscultation       Cardiovascular hypertension, Pt. on medications and Pt. on home beta blockers (-) angina Rhythm:Regular Rate:Normal     Neuro/Psych    GI/Hepatic Neg liver ROS, GERD  Controlled,  Endo/Other  negative endocrine ROS  Renal/GU negative Renal ROS     Musculoskeletal   Abdominal   Peds  Hematology negative hematology ROS (+)   Anesthesia Other Findings   Reproductive/Obstetrics                            Anesthesia Physical Anesthesia Plan  ASA: II  Anesthesia Plan: General   Post-op Pain Management:    Induction: Intravenous  PONV Risk Score and Plan: 4 or greater and Scopolamine patch - Pre-op, Dexamethasone and Ondansetron  Airway Management Planned: Oral ETT and Video Laryngoscope Planned  Additional Equipment:   Intra-op Plan:   Post-operative Plan: Extubation in OR  Informed Consent: I have reviewed the patients History and Physical, chart, labs and discussed the procedure including the risks, benefits and alternatives for the proposed anesthesia with the patient or authorized representative who has indicated his/her understanding and acceptance.   Dental advisory given  Plan Discussed with: Surgeon and CRNA  Anesthesia Plan Comments: (Plan routine monitors, GETA with VideoGlide intubation)        Anesthesia Quick Evaluation

## 2018-05-24 NOTE — Progress Notes (Signed)
Subjective: The patient is somnolent but easily arousable.  He looks well.  He is in no apparent distress.  Objective: Vital signs in last 24 hours: Temp:  [97.9 F (36.6 C)-98.1 F (36.7 C)] 98.1 F (36.7 C) (12/19 1436) Pulse Rate:  [55] 55 (12/19 0958) Resp:  [18] 18 (12/19 0958) BP: (139)/(77) 139/77 (12/19 0958) SpO2:  [98 %] 98 % (12/19 0958) Weight:  [91.9 kg] 91.9 kg (12/19 1014) Estimated body mass index is 29.06 kg/m as calculated from the following:   Height as of this encounter: 5\' 10"  (1.778 m).   Weight as of this encounter: 91.9 kg.   Intake/Output from previous day: No intake/output data recorded. Intake/Output this shift: Total I/O In: 800 [I.V.:800] Out: 100 [Blood:100]  Physical exam the patient is somnolent but arousable.  He is moving all 4 extremities well.  The patient's dressing is clean and dry.  There is no hematoma or shift.  Lab Results: No results for input(s): WBC, HGB, HCT, PLT in the last 72 hours. BMET No results for input(s): NA, K, CL, CO2, GLUCOSE, BUN, CREATININE, CALCIUM in the last 72 hours.  Studies/Results: No results found.  Assessment/Plan: The patient is doing well.  There are no family members available.  LOS: 0 days     [name] 05/24/2018, 3:00 PM

## 2018-05-24 NOTE — Op Note (Addendum)
Brief history: The patient is a 67 year old white male who has had previous neck surgeries.  He has complained of neck and left arm pain.  He failed medical management.  He was worked up with a cervical MRI which demonstrated C3-4 foraminal stenosis on the left.  I discussed the various treatment option with the patient including surgery.  He has weighed the risks, benefits, and alternatives to surgery and decided to proceed with a C3-4 anterior cervical discectomy, fusion and plating.  Preoperative diagnosis: C3-4 spondylosis, foraminal stenosis, cervicalgia, cervical radiculopathy  Postoperative diagnosis: The same  Procedure: C3-4 anterior cervical discectomy/decompression; C3-4 interbody arthrodesis with local morcellized autograft bone and Depuy bone graft extender; insertion of interbody prosthesis at C3-4 (Depuy peek interbody prosthesis); anterior cervical plating from C3-4 with Depuy titanium plate  Surgeon: Dr. Earle Gell  Asst.: Arnetha Massy nurse practitioner  Anesthesia: Gen. endotracheal  Estimated blood loss: 50 cc  Drains: None  Complications: None  Description of procedure: The patient was brought to the operating room by the anesthesia team. General endotracheal anesthesia was induced. A roll was placed under the patient's shoulders to keep the neck in the neutral position. The patient's anterior cervical region was then prepared with Betadine scrub and Betadine solution. Sterile drapes were applied.  The area to be incised was then injected with Marcaine with epinephrine solution. I then used a scalpel to make a transverse incision in the patient's left anterior neck. I used the Metzenbaum scissors to divide the platysmal muscle and then to dissect medial to the sternocleidomastoid muscle, jugular vein, and carotid artery. I carefully dissected through the scar tissue, down towards the anterior cervical spine identifying the esophagus and retracting it medially. Then using  Kitner swabs to clear soft tissue from the anterior cervical spine and to expose the upper aspect of the old plate at F6-3.   I then used electrocautery to detach the medial border of the longus colli muscle bilaterally from the C3-4 intervertebral disc spaces. I then inserted the Caspar self-retaining retractor underneath the longus colli muscle bilaterally to provide exposure.  We then incised the intervertebral disc at C3-4. We then performed a partial intervertebral discectomy with a pituitary forceps and the Karlin curettes.  I removed the upper screws from the old plate at W4-6 to make room for the new screws.  I then inserted distraction screws into the vertebral bodies at C3, and through the old screw hole at C4. We then distracted the interspace. We then used the high-speed drill to decorticate the vertebral endplates at K5-9, to drill away the remainder of the intervertebral disc, to drill away some posterior spondylosis, and to thin out the posterior longitudinal ligament. I then incised ligament with the arachnoid knife. We then removed the ligament with a Kerrison punches undercutting the vertebral endplates and decompressing the thecal sac. We then performed foraminotomies about the bilateral C4 nerve roots. This completed the decompression at this level.  We now turned our to attention to the interbody fusion. We used the trial spacers to determine the appropriate size for the interbody prosthesis. We then pre-filled prosthesis with a combination of local morcellized autograft bone that we obtained during decompression as well as Kinnex bone graft extender. We then inserted the prosthesis into the distracted interspace at C3-4. We then removed the distraction screws. There was a good snug fit of the prosthesis in the interspace.  Having completed the fusion we now turned attention to the anterior spinal instrumentation.  There was enough  room to place a new plate adjacent to the old plate at  A6-3.  We used the high-speed drill to drill away some anterior spondylosis at the disc spaces so that the plate lay down flat. We selected the appropriate length titanium anterior cervical plate. We laid it along the anterior aspect of the vertebral bodies from C3-4. We then drilled 14 mm holes at C3 and C4. We then secured the plate to the vertebral bodies by placing two 14 mm self-tapping screws at C3 and C4. We then obtained intraoperative radiograph. The demonstrating good position of the instrumentation. We therefore secured the screws the plate the locking each cam. This completed the instrumentation.  We then obtained hemostasis using bipolar electrocautery. We irrigated the wound out with bacitracin solution. We then removed the retractor. We inspected the esophagus for any damage. There was none apparent. We then reapproximated patient's platysmal muscle with interrupted 3-0 Vicryl suture. We then reapproximated the subcutaneous tissue with interrupted 3-0 Vicryl suture. The skin was reapproximated with Steri-Strips and benzoin. The wound was then covered with bacitracin ointment. A sterile dressing was applied. The drapes were removed. Patient was subsequently extubated by the anesthesia team and transported to the post anesthesia care unit in stable condition. All sponge instrument and needle counts were reportedly correct at the end of this case.

## 2018-05-24 NOTE — Progress Notes (Signed)
Pharmacy Antibiotic Note  [name] is a 67 y.o. male admitted on 05/24/2018 with surgical prophylaxis.  Pharmacy has been consulted for vancomcyin dosing. Per Op note and chart, No drains were placed.  Plan: Vancomycin 1000 mg IV for 1 dose 12 hours post-op.  Height: 5\' 10"  (177.8 cm) Weight: 202 lb 8 oz (91.9 kg) IBW/kg (Calculated) : 73  Temp (24hrs), Avg:97.7 F (36.5 C), Min:97.4 F (36.3 C), Max:98.1 F (36.7 C)  No results for input(s): WBC, CREATININE, LATICACIDVEN, VANCOTROUGH, VANCOPEAK, VANCORANDOM, GENTTROUGH, GENTPEAK, GENTRANDOM, TOBRATROUGH, TOBRAPEAK, TOBRARND, AMIKACINPEAK, AMIKACINTROU, AMIKACIN in the last 168 hours.  Estimated Creatinine Clearance: 76 mL/min (by C-G formula based on SCr of 1.09 mg/dL).    Allergies  Allergen Reactions  . Penicillins Hives    All over the body. Has patient had a PCN reaction causing immediate rash, facial/tongue/throat swelling, SOB or lightheadedness with hypotension: No Has patient had a PCN reaction causing severe rash involving mucus membranes or skin necrosis: No Has patient had a PCN reaction that required hospitalization: No Has patient had a PCN reaction occurring within the last 10 years: No If all of the above answers are "NO", then may proceed with Cephalosporin use.    Thank you for allowing [place] to participate in this patients care.   Jens Som, PharmD Please utilize [facility] (under [facility]) for appropriate number for your unit pharmacist. 05/24/2018 5:25 PM

## 2018-05-24 NOTE — H&P (Signed)
Subjective: The patient is a 67 year old white male whose had chronic pain.  He has had several neck surgeries.  The most recent of which was done by me in 2010 consisting of a C4-5 anterior cervical discectomy, fusion and plating.  The patient has developed recurrent neck and arm pain, numbness and tingling.  He has failed medical management and was worked up with a cervical MRI.  This demonstrates spondylosis and stenosis at C3-4.  I discussed the various treatment options with him including surgery.  He has decided to proceed with a C3-4 anterior cervical discectomy, fusion and plating.  Past Medical History:  Diagnosis Date  . Anxiety   . Arthritis   . BPH (benign prostatic hyperplasia)   . Cancer ([place])    SKIN  . Chronic neck pain    narcotics, work injury in the 1990s, s/p multiple procedures  . Constipation   . Difficulty sleeping   . GERD (gastroesophageal reflux disease)    not anymore  . H/O jaundice    AS CHILD  . Hypertension   . Nasal congestion   . Palpitations    "every once in a while"  . PONV (postoperative nausea and vomiting)     Past Surgical History:  Procedure Laterality Date  . [place], 2010   C6-C7, C5-C6, C4-C5  . COLONOSCOPY WITH PROPOFOL N/A 07/24/2012   Procedure: Colonoscopy with propofol;  Surgeon: Danie Binder, MD;  Location: [facility];  Service: Endoscopy;  Laterality: N/A;  in cecum at 0754, 21min total withdrawel time  . HERNIA REPAIR    . INGUINAL HERNIA REPAIR  11/08/2011   Procedure: HERNIA REPAIR INGUINAL ADULT;  Surgeon: Rolm Bookbinder, MD;  Location: [facility];  Service: General;  Laterality: Right;  . KNEE SURGERY  2009   right - scope  . [place]   right - scope  . TONSILLECTOMY      Allergies  Allergen Reactions  . Penicillins Hives    All over the body. Has patient had a PCN reaction causing immediate rash, facial/tongue/throat swelling, SOB or lightheadedness with hypotension: No Has patient had a  PCN reaction causing severe rash involving mucus membranes or skin necrosis: No Has patient had a PCN reaction that required hospitalization: No Has patient had a PCN reaction occurring within the last 10 years: No If all of the above answers are "NO", then may proceed with Cephalosporin use.     Social History   Tobacco Use  . Smoking status: Former Smoker    Last attempt to quit: 10/16/1993    Years since quitting: 24.6  . Smokeless tobacco: Never Used  Substance Use Topics  . Alcohol use: No    Family History  Problem Relation Age of Onset  . Colon cancer Neg Hx    Prior to Admission medications   Medication Sig Start Date End Date Taking? Authorizing Provider  alfuzosin (UROXATRAL) 10 MG 24 hr tablet Take 10 mg by mouth at bedtime.  09/26/11  Yes [provider]  ALPRAZolam Duanne Moron) 0.5 MG tablet Take 0.5 mg by mouth 2 (two) times daily as needed for anxiety.    Yes [provider]  lactulose (CHRONULAC) 10 GM/15ML solution Take 10 g by mouth at bedtime as needed for moderate constipation.    Yes [provider]  LINZESS 290 MCG CAPS capsule Take 290 mg by mouth daily as needed for constipation. 04/09/18  Yes [provider]  losartan (COZAAR) 25 MG tablet  Take 25 mg by mouth daily.   Yes [provider]  Melatonin 10 MG TABS Take 10 mg by mouth at bedtime.   Yes [provider]  Multiple Vitamin (MULTIVITAMIN WITH MINERALS) TABS Take 1 tablet by mouth daily.   Yes [provider]  oxyCODONE-acetaminophen (PERCOCET) 10-325 MG tablet Take 1 tablet by mouth every 4 (four) hours as needed for pain. 05/02/18  Yes [provider]  pregabalin (LYRICA) 75 MG capsule Take 75 mg by mouth 2 (two) times daily. 05/04/18  Yes [provider]  propranolol (INDERAL) 20 MG tablet Take 20 mg by mouth 2 (two) times daily.  06/21/12  Yes [provider]  zolpidem (AMBIEN CR) 12.5 MG CR tablet Take 12.5 mg by mouth  at bedtime.    Yes [provider]  clindamycin (CLEOCIN) 150 MG capsule Take 2 capsules (300 mg total) by mouth 4 (four) times daily. Patient not taking: Reported on 05/15/2018 05/01/14   Lily Kocher, PA-C  polyethylene glycol-electrolytes (TRILYTE) 420 G solution Take 4,000 mLs by mouth as directed. Patient not taking: Reported on 05/15/2018 07/11/12   Danie Binder, MD     Review of Systems  Positive ROS: As above  All other systems have been reviewed and were otherwise negative with the exception of those mentioned in the HPI and as above.  Objective: Vital signs in last 24 hours: Temp:  [97.9 F (36.6 C)] 97.9 F (36.6 C) (12/19 0958) Pulse Rate:  [55] 55 (12/19 0958) Resp:  [18] 18 (12/19 0958) BP: (139)/(77) 139/77 (12/19 0958) SpO2:  [98 %] 98 % (12/19 0958) Weight:  [91.9 kg] 91.9 kg (12/19 1014) Estimated body mass index is 29.06 kg/m as calculated from the following:   Height as of this encounter: 5\' 10"  (1.778 m).   Weight as of this encounter: 91.9 kg.   General Appearance: Alert Head: Normocephalic, without obvious abnormality, atraumatic Eyes: PERRL, conjunctiva/corneas clear, EOM's intact,    Ears: Normal  Throat: Normal  Neck: Limited range of motion.  His anterior cervical incisions are well-healed.  Back: unremarkable Lungs: Clear to auscultation bilaterally, respirations unlabored Heart: Regular rate and rhythm, no murmur, rub or gallop Abdomen: Soft, non-tender Extremities: Extremities normal, atraumatic, no cyanosis or edema Skin: unremarkable  NEUROLOGIC:   Mental status: alert and oriented,Motor Exam - grossly normal Sensory Exam - grossly normal Reflexes:  Coordination - grossly normal Gait - grossly normal Balance - grossly normal Cranial Nerves: I: smell Not tested  II: visual acuity  OS: Normal  OD: Normal   II: visual fields Full to confrontation  II: pupils Equal, round, reactive to light  III,VII: ptosis None   III,IV,VI: extraocular muscles  Full ROM  V: mastication Normal  V: facial light touch sensation  Normal  V,VII: corneal reflex  Present  VII: facial muscle function - upper  Normal  VII: facial muscle function - lower Normal  VIII: hearing Not tested  IX: soft palate elevation  Normal  IX,X: gag reflex Present  XI: trapezius strength  5/5  XI: sternocleidomastoid strength 5/5  XI: neck flexion strength  5/5  XII: tongue strength  Normal    Data Review Lab Results  Component Value Date   WBC 6.3 05/17/2018   HGB 14.3 05/17/2018   HCT 45.7 05/17/2018   MCV 90.5 05/17/2018   PLT 184 05/17/2018   Lab Results  Component Value Date   NA 142 05/17/2018   K 4.6 05/17/2018   CL 103 05/17/2018  CO2 31 05/17/2018   BUN 10 05/17/2018   CREATININE 1.09 05/17/2018   GLUCOSE 82 05/17/2018   No results found for: INR, PROTIME  Assessment/Plan: C3-4 disc degeneration, spondylosis, stenosis, cervicalgia, cervical radiculopathy, cervical myelopathy: I have discussed the situation with the patient.  I have reviewed his imaging studies with him and pointed out the abnormalities.  We have discussed the various treatment options including surgery.  I have described the surgical treatment option of a C3-4 anterior cervical discectomy, fusion and plating and possible removal of his old hardware/exploration of his fusion at C4-5.  I have shown him surgical models.  I have given him a surgical pamphlet.  We have discussed the risks, benefits, alternatives, expected postoperative course, and likelihood of achieving our goals with surgery.  I have answered all his questions.  He has decided to proceed with surgery.   [name] 05/24/2018 11:14 AM

## 2018-05-24 NOTE — Anesthesia Postprocedure Evaluation (Signed)
Anesthesia Post Note  Patient: [name]  Procedure(s) Performed: ANTERIOR CERVICAL DECOMPRESSION/DISCECTOMY FUSION, INTERBODY PROSTHESIS, PLATE/SCREWS CERVICAL THREE- CERVICAL FOUR; EXPLORE FUSION (N/A Spine Cervical)     Patient location during evaluation: PACU Anesthesia Type: General Level of consciousness: awake and alert, patient cooperative and oriented Pain management: pain level controlled Vital Signs Assessment: post-procedure vital signs reviewed and stable Respiratory status: spontaneous breathing, nonlabored ventilation and respiratory function stable Cardiovascular status: blood pressure returned to baseline and stable Postop Assessment: no apparent nausea or vomiting Anesthetic complications: no    Last Vitals:  Vitals:   05/24/18 1600 05/24/18 1620  BP:  (!) 146/87  Pulse: 68 70  Resp: (!) 9 18  Temp: (!) 36.4 C (!) 36.3 C  SpO2: 99% 97%    Last Pain:  Vitals:   05/24/18 1620  TempSrc: Oral  PainSc:                  Jenae Tomasello,E. Traveon Louro

## 2018-05-24 NOTE — Transfer of Care (Signed)
Immediate Anesthesia Transfer of Care Note  Patient: [name]  Procedure(s) Performed: ANTERIOR CERVICAL DECOMPRESSION/DISCECTOMY FUSION, INTERBODY PROSTHESIS, PLATE/SCREWS CERVICAL THREE- CERVICAL FOUR; EXPLORE FUSION (N/A Spine Cervical)  Patient Location: PACU  Anesthesia Type:General  Level of Consciousness: awake, alert  and oriented  Airway & Oxygen Therapy: Patient Spontanous Breathing and Patient connected to nasal cannula oxygen  Post-op Assessment: Report given to RN, Post -op Vital signs reviewed and stable and Patient moving all extremities X 4  Post vital signs: Reviewed and stable  Last Vitals:  Vitals Value Taken Time  BP 147/86 05/24/2018  2:35 PM  Temp    Pulse 75 05/24/2018  2:40 PM  Resp 11 05/24/2018  2:40 PM  SpO2 98 % 05/24/2018  2:40 PM  Vitals shown include unvalidated device data.  Last Pain:  Vitals:   05/24/18 1436  TempSrc:   PainSc: (P) 10-Worst pain ever      Patients Stated Pain Goal: 5 (46/27/03 5009)  Complications: No apparent anesthesia complications

## 2018-05-25 ENCOUNTER — Other Ambulatory Visit: Payer: Self-pay

## 2018-05-25 ENCOUNTER — Encounter (HOSPITAL_COMMUNITY): Payer: Self-pay | Admitting: Neurosurgery

## 2018-05-25 DIAGNOSIS — M4802 Spinal stenosis, cervical region: Secondary | ICD-10-CM | POA: Diagnosis not present

## 2018-05-25 MED ORDER — OXYCODONE-ACETAMINOPHEN 10-325 MG PO TABS: 1.0000 | ORAL_TABLET | ORAL | 0 refills | Status: AC | PRN

## 2018-05-25 MED ORDER — CYCLOBENZAPRINE HCL 10 MG PO TABS: 10.0000 mg | ORAL_TABLET | Freq: Three times a day (TID) | ORAL | 0 refills | Status: DC | PRN

## 2018-05-25 MED ORDER — DOCUSATE SODIUM 100 MG PO CAPS: 100.0000 mg | ORAL_CAPSULE | Freq: Two times a day (BID) | ORAL | 0 refills | Status: DC

## 2018-05-25 NOTE — Evaluation (Signed)
**Note DeLawrenceIdentified via Obfuscation** Occupational Therapy Evaluation and Discharge Patient Details Name: [name] MRN: [id number] DOB: [birth year] Today's Date: 05/25/2018    History of Present Illness s/p ACDF C3Lawrence4 PMH: chronic pain, HTN.   Clinical Impression   All education completed with pt verbalizing and/or demonstrating understanding. No further OT needs.    Follow Up Recommendations  No OT follow up    Equipment Recommendations  None recommended by OT    Recommendations for Other Services       Precautions / Restrictions Precautions Precautions: Cervical Precaution Booklet Issued: Yes (comment) Precaution Comments: educated in cervical precautions and reinforced with handout Required Braces or Orthoses: Cervical Brace Cervical Brace: Hard collar Restrictions Weight Bearing Restrictions: No      Mobility Bed Mobility Overal bed mobility: Modified Independent             General bed mobility comments: using log roll technique  Transfers Overall transfer level: Modified independent Equipment used: None             General transfer comment: slow to rise, but no physical assist    Balance                                           ADL either performed or assessed with clinical judgement   ADL Overall ADL's : Modified independent                                       General ADL Comments: Educated at length in cervical precautions related to ADL and IADL. Pt verbalized understanding.     Vision Baseline Vision/History: Wears glasses Patient Visual Report: No change from baseline       Perception     Praxis      Pertinent Vitals/Pain Pain Assessment: Faces Faces Pain Scale: Hurts little more Pain Location: neck Pain Descriptors / Indicators: Operative site guarding Pain Intervention(s): Monitored during session;Premedicated before session;Repositioned     Hand Dominance Right   Extremity/Trunk Assessment Upper Extremity  Assessment Upper Extremity Assessment: Overall WFL for tasks assessed   Lower Extremity Assessment Lower Extremity Assessment: Overall WFL for tasks assessed       Communication Communication Communication: No difficulties   Cognition Arousal/Alertness: Awake/alert Behavior During Therapy: WFL for tasks assessed/performed Overall Cognitive Status: Within Functional Limits for tasks assessed                                     General Comments       Exercises     Shoulder Instructions      Home Living Family/patient expects to be discharged to:: Private residence Living Arrangements: Alone Available Help at Discharge: Friend(s);Available PRN/intermittently Type of Home: House Home Access: Stairs to enter [employer] of Steps: 2   Home Layout: One level     Bathroom Shower/Tub: Occupational psychologist: Standard     Home Equipment: None          Prior Functioning/Environment Level of Independence: Independent                 OT Problem List:        OT Treatment/Interventions:      OT Goals(Current goals can be  found in the care plan section) Acute Rehab OT Goals Patient Stated Goal: return home to take care of his [place]  OT Frequency:     Barriers to D/C:            CoLawrenceevaluation              AMLawrencePAC OT "6 Clicks" Daily Activity     Outcome Measure Help from another person eating meals?: None Help from another person taking care of personal grooming?: None Help from another person toileting, which includes using toliet, bedpan, or urinal?: None Help from another person bathing (including washing, rinsing, drying)?: None Help from another person to put on and taking off regular upper body clothing?: None Help from another person to put on and taking off regular lower body clothing?: None 6 Click Score: 24   End of Session Equipment Utilized During Treatment: Cervical collar;Gait belt Nurse  Communication: Patient requests pain meds  Activity Tolerance: Patient tolerated treatment well Patient left: in bed;with call bell/phone within reach  OT Visit Diagnosis: Pain                Time: [id number] OT Time Calculation (min): 27 min Charges:  OT General Charges $OT Visit: 1 Visit OT Evaluation $OT Eval Low Complexity: 1 Low OT Treatments $Self Care/Home Management : 8Lawrence22 mins  Nestor Lewandowsky, OTR/L Acute Rehabilitation Services Pager: [phone] Office: [phone]  Malka So 05/25/2018, 9:59 AM

## 2018-05-25 NOTE — Discharge Instructions (Signed)
Wound Care Leave incision open to air. You may shower. Do not scrub directly on incision.  Do not put any creams, lotions, or ointments on incision. Activity Walk each and every day, increasing distance each day. No lifting greater than 5 lbs.  Avoid excessive neck motion. No driving for 2 weeks; may ride as a passenger locally. Wear neck brace at all times except when showering.  If provided soft collar, may wear for comfort unless otherwise instructed. Diet Resume your normal diet.  Return to Work Will be discussed at you follow up appointment. Call Your Doctor If Any of These Occur Redness, drainage, or swelling at the wound.  Temperature greater than 101 degrees. Severe pain not relieved by pain medication. Increased difficulty swallowing. Incision starts to come apart. Follow Up Appt Call today for appointment in 1-2 weeks [phone]) or for problems.  If you have any hardware placed in your spine, you will need an x-ray before your appointment.

## 2018-05-25 NOTE — Progress Notes (Signed)
Patient discharged to home, discharge orders reviewed with patient, patient understood plan.

## 2018-05-25 NOTE — Discharge Summary (Signed)
Physician Discharge Summary  Patient ID: [name] MRN: [id number] DOB/AGE: 1950-10-20 67 y.o.  Admit date: 05/24/2018 Discharge date: 05/25/2018  Admission Diagnoses: C3-4 spondylosis, cervicalgia, cervical radiculopathy  Discharge Diagnoses: The same Active Problems:   Cervical spondylosis with radiculopathy   Discharged Condition: good  Hospital Course: I performed a C3-4 anterior cervical discectomy, fusion and plating on the patient on 05/24/2018.  The surgery went well.  The patient's postoperative course was unremarkable.  On postoperative day #1 he looked and felt much better.  He requested discharge to home.  He was given written and oral discharge instructions.  All his questions were answered.  Consults: Occupational Therapy Significant Diagnostic Studies: None Treatments: C3-4 anterior cervical discectomy, fusion and plating Discharge Exam: Blood pressure 123/68, pulse (!) 54, temperature 97.9 F (36.6 C), temperature source Oral, resp. rate 16, height 5\' 10"  (1.778 m), weight 91.9 kg, SpO2 95 %. The patient is alert and pleasant.  He looks well.  His strength is normal.  His dressing is clean and dry.  There is no hematoma or shift.  Disposition: Home  Discharge Instructions    Call MD for:  difficulty breathing, headache or visual disturbances   Complete by:  As directed    Call MD for:  extreme fatigue   Complete by:  As directed    Call MD for:  hives   Complete by:  As directed    Call MD for:  persistant dizziness or light-headedness   Complete by:  As directed    Call MD for:  persistant nausea and vomiting   Complete by:  As directed    Call MD for:  redness, tenderness, or signs of infection (pain, swelling, redness, odor or green/yellow discharge around incision site)   Complete by:  As directed    Call MD for:  severe uncontrolled pain   Complete by:  As directed    Call MD for:  temperature >100.4   Complete by:  As directed    Diet - low  sodium heart healthy   Complete by:  As directed    Discharge instructions   Complete by:  As directed    Call [phone] for a followup appointment. Take a stool softener while you are using pain medications.   Driving Restrictions   Complete by:  As directed    Do not drive for 2 weeks.   Increase activity slowly   Complete by:  As directed    Lifting restrictions   Complete by:  As directed    Do not lift more than 5 pounds. No excessive bending or twisting.   May shower / Bathe   Complete by:  As directed    Remove the dressing for 3 days after surgery.  You may shower, but leave the incision alone.   Remove dressing in 48 hours   Complete by:  As directed    Your stitches are under the scan and will dissolve by themselves. The Steri-Strips will fall off after you take a few showers. Do not rub back or pick at the wound, Leave the wound alone.     Allergies as of 05/25/2018      Reactions   Penicillins Hives   All over the body. Has patient had a PCN reaction causing immediate rash, facial/tongue/throat swelling, SOB or lightheadedness with hypotension: No Has patient had a PCN reaction causing severe rash involving mucus membranes or skin necrosis: No Has patient had a PCN reaction that required hospitalization: No  Has patient had a PCN reaction occurring within the last 10 years: No If all of the above answers are "NO", then may proceed with Cephalosporin use.      Medication List    STOP taking these medications   clindamycin 150 MG capsule Commonly known as:  CLEOCIN     TAKE these medications   alfuzosin 10 MG 24 hr tablet Commonly known as:  UROXATRAL Take 10 mg by mouth at bedtime.   ALPRAZolam 0.5 MG tablet Commonly known as:  XANAX Take 0.5 mg by mouth 2 (two) times daily as needed for anxiety.   cyclobenzaprine 10 MG tablet Commonly known as:  FLEXERIL Take 1 tablet (10 mg total) by mouth 3 (three) times daily as needed for muscle spasms.   docusate  sodium 100 MG capsule Commonly known as:  COLACE Take 1 capsule (100 mg total) by mouth 2 (two) times daily.   lactulose 10 GM/15ML solution Commonly known as:  CHRONULAC Take 10 g by mouth at bedtime as needed for moderate constipation.   LINZESS 290 MCG Caps capsule Generic drug:  linaclotide Take 290 mg by mouth daily as needed for constipation.   losartan 25 MG tablet Commonly known as:  COZAAR Take 25 mg by mouth daily.   Melatonin 10 MG Tabs Take 10 mg by mouth at bedtime.   multivitamin with minerals Tabs tablet Take 1 tablet by mouth daily.   oxyCODONE-acetaminophen 10-325 MG tablet Commonly known as:  PERCOCET Take 1 tablet by mouth every 4 (four) hours as needed for pain.   polyethylene glycol-electrolytes 420 g solution Commonly known as:  TRILYTE Take 4,000 mLs by mouth as directed.   pregabalin 75 MG capsule Commonly known as:  LYRICA Take 75 mg by mouth 2 (two) times daily.   propranolol 20 MG tablet Commonly known as:  INDERAL Take 20 mg by mouth 2 (two) times daily.   zolpidem 12.5 MG CR tablet Commonly known as:  AMBIEN CR Take 12.5 mg by mouth at bedtime.        Signed: [name] 05/25/2018, 7:42 AM

## 2018-06-27 DIAGNOSIS — R3911 Hesitancy of micturition: Secondary | ICD-10-CM | POA: Diagnosis not present

## 2018-06-27 DIAGNOSIS — F411 Generalized anxiety disorder: Secondary | ICD-10-CM | POA: Diagnosis not present

## 2018-06-27 DIAGNOSIS — G4700 Insomnia, unspecified: Secondary | ICD-10-CM | POA: Diagnosis not present

## 2018-06-27 DIAGNOSIS — I10 Essential (primary) hypertension: Secondary | ICD-10-CM | POA: Diagnosis not present

## 2018-06-27 DIAGNOSIS — M542 Cervicalgia: Secondary | ICD-10-CM | POA: Diagnosis not present

## 2018-09-04 DIAGNOSIS — M25562 Pain in left knee: Secondary | ICD-10-CM | POA: Diagnosis not present

## 2018-09-04 DIAGNOSIS — M25561 Pain in right knee: Secondary | ICD-10-CM | POA: Diagnosis not present

## 2018-09-20 DIAGNOSIS — Z125 Encounter for screening for malignant neoplasm of prostate: Secondary | ICD-10-CM | POA: Diagnosis not present

## 2018-09-20 DIAGNOSIS — N400 Enlarged prostate without lower urinary tract symptoms: Secondary | ICD-10-CM | POA: Diagnosis not present

## 2018-09-20 DIAGNOSIS — R7301 Impaired fasting glucose: Secondary | ICD-10-CM | POA: Diagnosis not present

## 2018-09-20 DIAGNOSIS — I10 Essential (primary) hypertension: Secondary | ICD-10-CM | POA: Diagnosis not present

## 2018-09-26 DIAGNOSIS — G4700 Insomnia, unspecified: Secondary | ICD-10-CM | POA: Diagnosis not present

## 2018-09-26 DIAGNOSIS — M5030 Other cervical disc degeneration, unspecified cervical region: Secondary | ICD-10-CM | POA: Diagnosis not present

## 2018-09-26 DIAGNOSIS — I10 Essential (primary) hypertension: Secondary | ICD-10-CM | POA: Diagnosis not present

## 2018-09-26 DIAGNOSIS — N400 Enlarged prostate without lower urinary tract symptoms: Secondary | ICD-10-CM | POA: Diagnosis not present

## 2018-09-26 DIAGNOSIS — G250 Essential tremor: Secondary | ICD-10-CM | POA: Diagnosis not present

## 2018-09-26 DIAGNOSIS — G629 Polyneuropathy, unspecified: Secondary | ICD-10-CM | POA: Diagnosis not present

## 2018-09-26 DIAGNOSIS — F331 Major depressive disorder, recurrent, moderate: Secondary | ICD-10-CM | POA: Diagnosis not present

## 2018-09-26 DIAGNOSIS — F411 Generalized anxiety disorder: Secondary | ICD-10-CM | POA: Diagnosis not present

## 2018-09-26 DIAGNOSIS — K5903 Drug induced constipation: Secondary | ICD-10-CM | POA: Diagnosis not present

## 2018-10-10 DIAGNOSIS — Z0000 Encounter for general adult medical examination without abnormal findings: Secondary | ICD-10-CM | POA: Diagnosis not present

## 2018-11-09 ENCOUNTER — Other Ambulatory Visit (HOSPITAL_COMMUNITY): Payer: Self-pay | Admitting: Family Medicine

## 2018-11-09 ENCOUNTER — Ambulatory Visit (HOSPITAL_COMMUNITY)
Admission: RE | Admit: 2018-11-09 | Discharge: 2018-11-09 | Disposition: A | Discharge status: Home / Self Care | Payer: Medicare HMO | Source: Ambulatory Visit | Attending: Family Medicine | Admitting: Family Medicine

## 2018-11-09 ENCOUNTER — Other Ambulatory Visit: Payer: Self-pay

## 2018-11-09 DIAGNOSIS — M25571 Pain in right ankle and joints of right foot: Secondary | ICD-10-CM | POA: Diagnosis not present

## 2018-11-13 ENCOUNTER — Other Ambulatory Visit (HOSPITAL_COMMUNITY): Payer: Self-pay | Admitting: Orthopaedic Surgery

## 2018-11-13 ENCOUNTER — Other Ambulatory Visit: Payer: Self-pay

## 2018-11-13 ENCOUNTER — Ambulatory Visit (HOSPITAL_COMMUNITY)
Admission: RE | Admit: 2018-11-13 | Discharge: 2018-11-13 | Disposition: A | Discharge status: Home / Self Care | Payer: Medicare HMO | Source: Ambulatory Visit | Attending: Orthopaedic Surgery | Admitting: Orthopaedic Surgery

## 2018-11-13 DIAGNOSIS — M2540 Effusion, unspecified joint: Secondary | ICD-10-CM | POA: Diagnosis not present

## 2018-11-13 DIAGNOSIS — M7989 Other specified soft tissue disorders: Secondary | ICD-10-CM | POA: Insufficient documentation

## 2018-11-13 DIAGNOSIS — M25571 Pain in right ankle and joints of right foot: Secondary | ICD-10-CM | POA: Diagnosis not present

## 2018-11-13 DIAGNOSIS — M79604 Pain in right leg: Secondary | ICD-10-CM

## 2018-11-13 NOTE — Progress Notes (Signed)
Lower extremity venous has been completed.   Preliminary results in CV Proc.   Abram Sander 11/13/2018 3:52 PM

## 2018-11-22 DIAGNOSIS — F419 Anxiety disorder, unspecified: Secondary | ICD-10-CM | POA: Diagnosis not present

## 2018-11-22 DIAGNOSIS — R7301 Impaired fasting glucose: Secondary | ICD-10-CM | POA: Diagnosis not present

## 2018-11-22 DIAGNOSIS — F331 Major depressive disorder, recurrent, moderate: Secondary | ICD-10-CM | POA: Diagnosis not present

## 2018-11-22 DIAGNOSIS — M25571 Pain in right ankle and joints of right foot: Secondary | ICD-10-CM | POA: Diagnosis not present

## 2018-11-22 DIAGNOSIS — F411 Generalized anxiety disorder: Secondary | ICD-10-CM | POA: Diagnosis not present

## 2018-11-22 DIAGNOSIS — G4700 Insomnia, unspecified: Secondary | ICD-10-CM | POA: Diagnosis not present

## 2018-11-22 DIAGNOSIS — M79661 Pain in right lower leg: Secondary | ICD-10-CM | POA: Diagnosis not present

## 2018-11-22 DIAGNOSIS — I10 Essential (primary) hypertension: Secondary | ICD-10-CM | POA: Diagnosis not present

## 2018-12-12 ENCOUNTER — Encounter: Payer: Self-pay | Admitting: Gastroenterology

## 2018-12-18 DIAGNOSIS — R1310 Dysphagia, unspecified: Secondary | ICD-10-CM | POA: Insufficient documentation

## 2018-12-18 NOTE — Patient Instructions (Addendum)
We will get you scheduled for an upper endoscopy with Dr. Oneida Alar in the near future.   We will see you back after your procedure. Call if concerns prior to your next visit.   Aliene Altes, PA-C [facility]
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
## 2018-12-18 NOTE — Assessment & Plan Note (Addendum)
69 y.o. male with past medical history significant for PONV, HTN, GERD, constipation, chronic neck pain, arthritis, skin cancer, and anxiety. Patient has had dysphagia since his most recent neck surgery in December 2019. Total of 5 neck surgeries. Trouble with swallowing most foods including soft foods, breads, and meets. Pill dysphagia. Crunchy foods seem to go down ok. Sometimes, even the motion of swallowing is difficult. Minimal improvement since surgery. Also with sensation of burning as warm foods go down. No heartburn or reflux symptoms or epigastric pain. No melena. Saw Dr. Arnoldo Morale in May 2020 for follow-up. X-ray of neck at that time without significant findings and patient was advised to see GI. Differential-stricture possibly related to underlying scar tissue vs dysmotility.   Proceed with upper endoscopy +/- dilation with propofol in the near future with Dr. Oneida Alar. The risks, benefits, and alternatives have been discussed in detail with patient. They have stated understanding and desire to proceed.  If no significant findings on EGD, will consider BPE.  Follow-up after procedure.

## 2018-12-19 ENCOUNTER — Other Ambulatory Visit: Payer: Self-pay

## 2018-12-19 ENCOUNTER — Encounter: Payer: Self-pay | Admitting: Gastroenterology

## 2018-12-19 ENCOUNTER — Ambulatory Visit (INDEPENDENT_AMBULATORY_CARE_PROVIDER_SITE_OTHER): Payer: No Typology Code available for payment source | Admitting: Gastroenterology

## 2018-12-19 ENCOUNTER — Encounter: Payer: Self-pay | Admitting: *Deleted

## 2018-12-19 ENCOUNTER — Other Ambulatory Visit: Payer: Self-pay | Admitting: *Deleted

## 2018-12-19 ENCOUNTER — Telehealth: Payer: Self-pay | Admitting: *Deleted

## 2018-12-19 VITALS — BP 127/79 | HR 55 | Temp 97.3°F | Ht 70.0 in | Wt 194.0 lb

## 2018-12-19 DIAGNOSIS — R1310 Dysphagia, unspecified: Secondary | ICD-10-CM

## 2018-12-19 NOTE — Telephone Encounter (Signed)
LMOVM to schedule egd +/-dil with MAC with [facility]

## 2018-12-19 NOTE — Telephone Encounter (Signed)
Patient called back. He is scheduled for 7/27 at 12:30pm. Patient aware will mail instructions and also discussed instructions in detail with him. Also aware will need pre-op appt. He will be contacted with this appt

## 2018-12-20 NOTE — Telephone Encounter (Signed)
PA for EGD +/- dil was approved via [employer]. Auth# [id number] dates 12/31/2018-01/30/2019

## 2018-12-21 DIAGNOSIS — F331 Major depressive disorder, recurrent, moderate: Secondary | ICD-10-CM | POA: Diagnosis not present

## 2018-12-21 DIAGNOSIS — F419 Anxiety disorder, unspecified: Secondary | ICD-10-CM | POA: Diagnosis not present

## 2018-12-21 DIAGNOSIS — G629 Polyneuropathy, unspecified: Secondary | ICD-10-CM | POA: Diagnosis not present

## 2018-12-21 DIAGNOSIS — R251 Tremor, unspecified: Secondary | ICD-10-CM | POA: Diagnosis not present

## 2018-12-21 DIAGNOSIS — I10 Essential (primary) hypertension: Secondary | ICD-10-CM | POA: Diagnosis not present

## 2018-12-21 DIAGNOSIS — F339 Major depressive disorder, recurrent, unspecified: Secondary | ICD-10-CM | POA: Diagnosis not present

## 2018-12-21 DIAGNOSIS — R3915 Urgency of urination: Secondary | ICD-10-CM | POA: Diagnosis not present

## 2018-12-21 DIAGNOSIS — G8929 Other chronic pain: Secondary | ICD-10-CM | POA: Diagnosis not present

## 2018-12-21 NOTE — Progress Notes (Signed)
cc'd to pcp 

## 2018-12-25 ENCOUNTER — Telehealth: Payer: Self-pay | Admitting: Gastroenterology

## 2018-12-25 NOTE — Telephone Encounter (Signed)
[phone] patient called to cancel his procedure for now, it is a wokers comp case and they have not approved for him to have this done, he will call back when they approve it

## 2018-12-25 NOTE — Telephone Encounter (Signed)
Called pt, he has already spoke to workers comp and they have submitted case to be approved. Informed him procedure was approved by [facility]. He is having trouble swallowing. Advised him to wait for now to see if workers comp approves case in next couple days before cancelling procedure. Advised him to call office back by Thursday to let us know if procedure needs to be cancelled. He is aware office will be closed Friday.

## 2018-12-26 ENCOUNTER — Telehealth: Payer: Self-pay | Admitting: Gastroenterology

## 2018-12-26 NOTE — Telephone Encounter (Signed)
Noted. Patient called yesterday but procedure was not cancelled.

## 2018-12-26 NOTE — Telephone Encounter (Signed)
PATIENT CALLED AND SAID THAT WORKMANS COMP APPROVED THE PROCEDURE

## 2018-12-27 ENCOUNTER — Telehealth: Payer: Self-pay | Admitting: Gastroenterology

## 2018-12-27 ENCOUNTER — Telehealth: Payer: Self-pay | Admitting: *Deleted

## 2018-12-27 NOTE — Telephone Encounter (Signed)
Tried to call Vaughan Basta, no answer.

## 2018-12-27 NOTE — Telephone Encounter (Signed)
Called pt, he has been speaking to Ruffin Frederick RN about workers comp for procedure. She told him yesterday the case was approved.  Tried to call Vaughan Basta [phone]), no answer, LMOVM for return call as soon as possible.

## 2018-12-27 NOTE — Telephone Encounter (Signed)
Patient called back and stated he will be at his pre-op appt tomorrow.

## 2018-12-27 NOTE — Telephone Encounter (Signed)
LMOVM to inform Tia I tried to call workers Designer, multimedia.

## 2018-12-27 NOTE — Telephone Encounter (Signed)
Tia from the preservice center called with questions about patient's procedure with SF on Monday and being as workman's comp.. Please call her back at [phone] ext [phone]
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
## 2018-12-27 NOTE — Telephone Encounter (Signed)
Endo called. Patient cancelled his pre-op appt scheduled for tomorrow at 7/24 at 11:30am via automated system. I advised her patient called yesterday afternoon stating he was still going to have procedure done. She was going to add his pre-op appt back on for same time/date. Kim tried calling patient but could not reach him.  I called patient-LMOVM

## 2018-12-28 ENCOUNTER — Encounter (HOSPITAL_COMMUNITY): Payer: Self-pay

## 2018-12-28 ENCOUNTER — Other Ambulatory Visit: Payer: Self-pay

## 2018-12-28 ENCOUNTER — Encounter (HOSPITAL_COMMUNITY)
Admission: RE | Admit: 2018-12-28 | Discharge: 2018-12-28 | Disposition: A | Discharge status: Home / Self Care | Payer: Medicare HMO | Source: Ambulatory Visit | Attending: Gastroenterology | Admitting: Gastroenterology

## 2018-12-28 ENCOUNTER — Inpatient Hospital Stay (HOSPITAL_COMMUNITY): Admission: RE | Admit: 2018-12-28 | Payer: Medicare HMO | Source: Ambulatory Visit

## 2018-12-28 ENCOUNTER — Other Ambulatory Visit (HOSPITAL_COMMUNITY)
Admission: RE | Admit: 2018-12-28 | Discharge: 2018-12-28 | Disposition: A | Discharge status: Home / Self Care | Payer: Medicare HMO | Source: Ambulatory Visit | Attending: Gastroenterology | Admitting: Gastroenterology

## 2018-12-28 DIAGNOSIS — Z1159 Encounter for screening for other viral diseases: Secondary | ICD-10-CM | POA: Insufficient documentation

## 2018-12-29 LAB — SARS CORONAVIRUS 2 (TAT 6-24 HRS): SARS Coronavirus 2: NEGATIVE

## 2018-12-31 ENCOUNTER — Ambulatory Visit (HOSPITAL_COMMUNITY)
Admission: RE | Admit: 2018-12-31 | Discharge: 2018-12-31 | Disposition: A | Discharge status: Home / Self Care | Payer: Medicare HMO | Attending: Gastroenterology | Admitting: Gastroenterology

## 2018-12-31 ENCOUNTER — Ambulatory Visit (HOSPITAL_COMMUNITY): Payer: Medicare HMO | Admitting: Anesthesiology

## 2018-12-31 ENCOUNTER — Other Ambulatory Visit: Payer: Self-pay

## 2018-12-31 ENCOUNTER — Encounter (HOSPITAL_COMMUNITY): Payer: Self-pay

## 2018-12-31 ENCOUNTER — Encounter (HOSPITAL_COMMUNITY)
Admission: RE | Disposition: A | Discharge status: Home / Self Care | Payer: Self-pay | Source: Home / Self Care | Attending: Gastroenterology

## 2018-12-31 DIAGNOSIS — Z85828 Personal history of other malignant neoplasm of skin: Secondary | ICD-10-CM | POA: Insufficient documentation

## 2018-12-31 DIAGNOSIS — K222 Esophageal obstruction: Secondary | ICD-10-CM | POA: Diagnosis not present

## 2018-12-31 DIAGNOSIS — I10 Essential (primary) hypertension: Secondary | ICD-10-CM | POA: Insufficient documentation

## 2018-12-31 DIAGNOSIS — K219 Gastro-esophageal reflux disease without esophagitis: Secondary | ICD-10-CM | POA: Diagnosis not present

## 2018-12-31 DIAGNOSIS — F419 Anxiety disorder, unspecified: Secondary | ICD-10-CM | POA: Insufficient documentation

## 2018-12-31 DIAGNOSIS — N400 Enlarged prostate without lower urinary tract symptoms: Secondary | ICD-10-CM | POA: Insufficient documentation

## 2018-12-31 DIAGNOSIS — K2970 Gastritis, unspecified, without bleeding: Secondary | ICD-10-CM | POA: Diagnosis not present

## 2018-12-31 DIAGNOSIS — Z79899 Other long term (current) drug therapy: Secondary | ICD-10-CM | POA: Insufficient documentation

## 2018-12-31 DIAGNOSIS — R1310 Dysphagia, unspecified: Secondary | ICD-10-CM

## 2018-12-31 DIAGNOSIS — K5710 Diverticulosis of small intestine without perforation or abscess without bleeding: Secondary | ICD-10-CM | POA: Insufficient documentation

## 2018-12-31 DIAGNOSIS — K2980 Duodenitis without bleeding: Secondary | ICD-10-CM | POA: Diagnosis not present

## 2018-12-31 DIAGNOSIS — Z87891 Personal history of nicotine dependence: Secondary | ICD-10-CM | POA: Insufficient documentation

## 2018-12-31 HISTORY — PX: SAVORY DILATION: SHX5439

## 2018-12-31 HISTORY — PX: BIOPSY: SHX5522

## 2018-12-31 HISTORY — PX: ESOPHAGOGASTRODUODENOSCOPY (EGD) WITH PROPOFOL: SHX5813

## 2018-12-31 SURGERY — ESOPHAGOGASTRODUODENOSCOPY (EGD) WITH PROPOFOL
Anesthesia: General

## 2018-12-31 MED ORDER — LACTATED RINGERS IV SOLN
INTRAVENOUS | Status: DC | PRN
  Administered 2018-12-31: 12:00:00 via INTRAVENOUS

## 2018-12-31 MED ORDER — LIDOCAINE VISCOUS HCL 2 % MT SOLN
5.0000 mL | Freq: Once | OROMUCOSAL | Status: AC
  Administered 2018-12-31: 5 mL via OROMUCOSAL

## 2018-12-31 MED ORDER — KETAMINE HCL 10 MG/ML IJ SOLN
INTRAMUSCULAR | Status: DC | PRN
  Administered 2018-12-31 (×2): 10 mg via INTRAVENOUS

## 2018-12-31 MED ORDER — KETAMINE HCL 50 MG/5ML IJ SOSY
PREFILLED_SYRINGE | INTRAMUSCULAR | Status: AC
  Filled 2018-12-31: qty 5

## 2018-12-31 MED ORDER — EPHEDRINE SULFATE 50 MG/ML IJ SOLN
INTRAMUSCULAR | Status: DC | PRN
  Administered 2018-12-31: 10 mg via INTRAVENOUS

## 2018-12-31 MED ORDER — LIDOCAINE VISCOUS HCL 2 % MT SOLN
OROMUCOSAL | Status: AC
  Filled 2018-12-31: qty 15

## 2018-12-31 MED ORDER — LIDOCAINE HCL (PF) 1 % IJ SOLN
INTRAMUSCULAR | Status: AC
  Filled 2018-12-31: qty 10

## 2018-12-31 MED ORDER — CHLORHEXIDINE GLUCONATE CLOTH 2 % EX PADS: 6.0000 | MEDICATED_PAD | Freq: Once | CUTANEOUS | Status: DC

## 2018-12-31 MED ORDER — PROPOFOL 10 MG/ML IV BOLUS
INTRAVENOUS | Status: AC
  Filled 2018-12-31: qty 20

## 2018-12-31 MED ORDER — GLYCOPYRROLATE 0.2 MG/ML IJ SOLN
INTRAMUSCULAR | Status: AC
  Filled 2018-12-31: qty 1

## 2018-12-31 MED ORDER — PROPOFOL 500 MG/50ML IV EMUL
INTRAVENOUS | Status: DC | PRN
  Administered 2018-12-31: 150 ug/kg/min via INTRAVENOUS

## 2018-12-31 MED ORDER — PROPOFOL 10 MG/ML IV BOLUS
INTRAVENOUS | Status: DC | PRN
  Administered 2018-12-31 (×2): 20 mg via INTRAVENOUS

## 2018-12-31 NOTE — Discharge Instructions (Signed)
I dilated your esophagus. You have a stricture near the base of your esophagus AND ESOPHAGITIS LIKELY DUE TO UNCONTROLLED REFLUX.Marland Kitchen  You have A SMALL HIATAL HERNIA AND mild gastritis. I biopsied your ESOPHAGUS AND stomach.   DRINK WATER TO KEEP YOUR URINE LIGHT YELLOW.  AVOID REFLUX TRIGGERS. SEE INFO BELOW.  STRICTLY FOLLOW A LOW FAT DIET. SEE INFO BELOW.   YOUR BIOPSY RESULTS WILL BE BACK IN 5 BUSINESS DAYS.  Please CALL IN ONE MONTH IF SYMPTOMS ARE NOT IMPROVED.   FOLLOW UP IN 4 MOS.  UPPER ENDOSCOPY AFTER CARE Read the instructions outlined below and refer to this sheet in the next week. These discharge instructions provide you with general information on caring for yourself after you leave the hospital. While your treatment has been planned according to the most current medical practices available, unavoidable complications occasionally occur. If you have any problems or questions after discharge, call DR. Tahiri Shareef, [phone].  ACTIVITY  You may resume your regular activity, but move at a slower pace for the next 24 hours.   Take frequent rest periods for the next 24 hours.   Walking will help get rid of the air and reduce the bloated feeling in your belly (abdomen).   No driving for 24 hours (because of the medicine (anesthesia) used during the test).   You may shower.   Do not sign any important legal documents or operate any machinery for 24 hours (because of the anesthesia used during the test).    NUTRITION  Drink plenty of fluids.   You may resume your normal diet as instructed by your doctor.   Begin with a light meal and progress to your normal diet. Heavy or fried foods are harder to digest and may make you feel sick to your stomach (nauseated).   Avoid alcoholic beverages for 24 hours or as instructed.    MEDICATIONS  You may resume your normal medications.   WHAT YOU CAN EXPECT TODAY  Some feelings of bloating in the abdomen.   Passage of more  gas than usual.    IF YOU HAD A BIOPSY TAKEN DURING THE UPPER ENDOSCOPY:  Eat a soft diet IF YOU HAVE NAUSEA, BLOATING, ABDOMINAL PAIN, OR VOMITING.    FINDING OUT THE RESULTS OF YOUR TEST Not all test results are available during your visit. DR. Oneida Alar WILL CALL YOU WITHIN 14 DAYS OF YOUR PROCEDUE WITH YOUR RESULTS. Do not assume everything is normal if you have not heard from DR. Breleigh Carpino, CALL HER OFFICE AT [phone].  SEEK IMMEDIATE MEDICAL ATTENTION AND CALL THE OFFICE: [phone] IF:  You have more than a spotting of blood in your stool.   Your belly is swollen (abdominal distention).   You are nauseated or vomiting.   You have a temperature over 101F.   You have abdominal pain or discomfort that is severe or gets worse throughout the day.  Gastritis  Gastritis is an inflammation (the body's way of reacting to injury and/or infection) of the stomach. It is often caused by viral or bacterial (germ) infections. It can also be caused BY ASPIRIN, BC/GOODY POWDER'S, (IBUPROFEN) MOTRIN, OR ALEVE (NAPROXEN), chemicals (including alcohol), SPICY FOODS, and medications. This illness may be associated with generalized malaise (feeling tired, not well), UPPER ABDOMINAL STOMACH cramps, and fever. One common bacterial cause of gastritis is an organism known as H. Pylori. This can be treated with antibiotics.   ESOPHAGEAL STRICTURE  Esophageal strictures can be caused by stomach acid backing up into  the tube that carries food from the mouth down to the stomach (lower esophagus).  TREATMENT There are a number of  medicines used to treat reflux/stricture, including: Antacids.  Proton-pump inhibitors: OMEPRAZOLE PEPCID OR TAGAMET    Lifestyle and home remedies TO CONTROL REFLUX/HEARTBURN You may eliminate or reduce the frequency of heartburn by making the following lifestyle changes:   Control your weight. Being overweight is a major risk factor for heartburn and GERD. Excess pounds  put pressure on your abdomen, pushing up your stomach and causing acid to back up into your esophagus.    Eat smaller meals. 4 TO 6 MEALS A DAY. This reduces pressure on the lower esophageal sphincter, helping to prevent the valve from opening and acid from washing back into your esophagus.    Loosen your belt. Clothes that fit tightly around your waist put pressure on your abdomen and the lower esophageal sphincter.    Eliminate heartburn triggers. Everyone has specific triggers. Common triggers such as fatty or fried foods, spicy food, tomato sauce, carbonated beverages, alcohol, chocolate, mint, garlic, onion, caffeine and nicotine may make heartburn worse.    Avoid stooping or bending. Tying your shoes is OK. Bending over for longer periods to weed your garden isn't, especially soon after eating.    Don't lie down after a meal. Wait at least three to four hours after eating before going to bed, and don't lie down right after eating.    Alternative medicine  Several home remedies exist for treating GERD, but they provide only temporary relief. They include drinking baking soda (sodium bicarbonate) added to water or drinking other fluids such as baking soda mixed with cream of tartar and water.   Although these liquids create temporary relief by neutralizing, washing away or buffering acids, eventually they aggravate the situation by adding gas and fluid to your stomach, increasing pressure and causing more acid reflux. Further, adding more sodium to your diet may increase your blood pressure and add stress to your heart, and excessive bicarbonate ingestion can alter the acid-base balance in your body.   Low-Fat Diet  BREADS, CEREALS, PASTA, RICE, DRIED PEAS, AND BEANS  These products are high in carbohydrates and most are low in fat. Therefore, they can be increased in the diet as substitutes for fatty foods. They too, however, contain calories and should not be eaten in excess.  Cereals can be eaten for snacks as well as for breakfast.   Include foods that contain fiber (fruits, vegetables, whole grains, and legumes). Research shows that fiber may lower blood cholesterol levels, especially the water-soluble fiber found in fruits, vegetables, oat products, and legumes.  FRUITS AND VEGETABLES  It is good to eat fruits and vegetables. Besides being sources of fiber, both are rich in vitamins and some minerals. They help you get the daily allowances of these nutrients. Fruits and vegetables can be used for snacks and desserts.  MEATS  Limit lean meat, chicken, [place], and fish to no more than 6 ounces per day.  Beef, Pork, and Lamb  Use lean cuts of beef, pork, and lamb. Lean cuts include:   Extra-lean ground beef.   Arm roast.   Sirloin tip.   Center-cut ham.   Round steak.   Loin chops.   Rump roast.   Tenderloin.   Trim all fat off the outside of meats before cooking. It is not necessary to severely decrease the intake of red meat, but lean choices should be made. Lean meat is rich  in protein and contains a highly absorbable form of iron. Premenopausal women, in particular, should avoid reducing lean red meat because this could increase the risk for low red blood cells (iron-deficiency anemia).  Processed Meats  Processed meats, such as bacon, bologna, salami, sausage, and hot dogs contain large quantities of fat, are not rich in valuable nutrients, and should not be eaten very often.  Organ Meats  The organ meats, such as liver, sweetbreads, kidneys, and brain are very rich in cholesterol. They should be limited.  Chicken and [place]  These are good sources of protein. The fat of poultry can be reduced by removing the skin and underlying fat layers before cooking. Chicken and [place] can be substituted for lean red meat in the diet. Poultry should not be fried or covered with high-fat sauces.  Fish and Shellfish  Fish is a good source of  protein. Shellfish contain cholesterol, but they usually are low in saturated fatty acids. The preparation of fish is important. Like chicken and [place], they should not be fried or covered with high-fat sauces.  EGGS  Egg yolks often are hidden in cooked and processed foods. Egg whites contain no fat or cholesterol. They can be eaten often. Try 1 to 2 egg whites instead of whole eggs in recipes or use egg substitutes that do not contain yolk.  MILK AND DAIRY PRODUCTS  Use skim or 1% milk instead of 2% or whole milk. Decrease whole milk, natural, and processed cheeses. Use nonfat or low-fat (2%) cottage cheese or low-fat cheeses made from vegetable oils. Choose nonfat or low-fat (1 to 2%) yogurt. Experiment with evaporated skim milk in recipes that call for heavy cream. Substitute low-fat yogurt or low-fat cottage cheese for sour cream in dips and salad dressings. Have at least 2 servings of low-fat dairy products, such as 2 glasses of skim (or 1%) milk each day to help get your daily calcium intake.  FATS AND OILS  Reduce the total intake of fats, especially saturated fat. Butterfat, lard, and beef fats are high in saturated fat and cholesterol. These should be avoided as much as possible. Vegetable fats do not contain cholesterol, but certain vegetable fats, such as coconut oil, palm oil, and palm kernel oil are very high in saturated fats. These should be limited. These fats are often used in bakery goods, processed foods, popcorn, oils, and nondairy creamers. Vegetable shortenings and some peanut butters contain hydrogenated oils, which are also saturated fats. Read the labels on these foods and check for saturated vegetable oils.  Unsaturated vegetable oils and fats do not raise blood cholesterol. However, they should be limited because they are fats and are high in calories. Total fat should still be limited to 30% of your daily caloric intake. Desirable liquid vegetable oils are corn oil,  cottonseed oil, olive oil, canola oil, safflower oil, soybean oil, and sunflower oil. Peanut oil is not as good, but small amounts are acceptable. Buy a heart-healthy tub margarine that has no partially hydrogenated oils in the ingredients. Mayonnaise and salad dressings often are made from unsaturated fats, but they should also be limited because of their high calorie and fat content.  Seeds, nuts, peanut butter, olives, and avocados are high in fat, but the fat is mainly the unsaturated type. These foods should be limited mainly to avoid excess calories and fat.  OTHER EATING TIPS  Snacks   Most sweets should be limited as snacks. They tend to be rich in calories and fats,  and their caloric content outweighs their nutritional value. Some good choices in snacks are graham crackers, melba toast, soda crackers, bagels (no egg), English muffins, fruits, and vegetables. These snacks are preferable to snack crackers, [place] fries, and chips. Popcorn should be air-popped or cooked in small amounts of liquid vegetable oil.  Desserts  Eat fruit, low-fat yogurt, and fruit ices instead of pastries, cake, and cookies. Sherbet, angel food cake, gelatin dessert, frozen low-fat yogurt, or other frozen products that do not contain saturated fat (pure fruit juice bars, frozen ice pops) are also acceptable.   COOKING METHODS  Choose those methods that use little or no fat. They include:  Poaching.   Braising.   Steaming.   Grilling.   Baking.   Stir-frying.   Broiling.   Microwaving.   Foods can be cooked in a nonstick pan without added fat, or use a nonfat cooking spray in regular cookware. Limit fried foods and avoid frying in saturated fat. Add moisture to lean meats by using water, broth, cooking wines, and other nonfat or low-fat sauces along with the cooking methods mentioned above.  Soups and stews should be chilled after cooking. The fat that forms on top after a few hours in the  refrigerator should be skimmed off. When preparing meals, avoid using excess salt. Salt can contribute to raising blood pressure in some people.  EATING AWAY FROM HOME  Order entres, potatoes, and vegetables without sauces or butter. When meat exceeds the size of a deck of cards (3 to 4 ounces), the rest can be taken home for another meal.  Choose vegetable or fruit salads and ask for low-calorie salad dressings to be served on the side. Use dressings sparingly. Limit high-fat toppings, such as bacon, crumbled eggs, cheese, sunflower seeds, and olives. Ask for heart-healthy tub margarine instead of butter.

## 2018-12-31 NOTE — Anesthesia Postprocedure Evaluation (Signed)
Anesthesia Post Note  Patient: [name]  Procedure(s) Performed: ESOPHAGOGASTRODUODENOSCOPY (EGD) WITH PROPOFOL (N/A ) SAVORY DILATION (N/A ) BIOPSY  Patient location during evaluation: PACU Anesthesia Type: General Level of consciousness: awake and alert and oriented Pain management: pain level controlled Vital Signs Assessment: post-procedure vital signs reviewed and stable Cardiovascular status: stable Postop Assessment: no apparent nausea or vomiting Anesthetic complications: no     Last Vitals:  Vitals:   12/31/18 1141 12/31/18 1222  BP: (!) 143/71 (!) 111/58  Pulse: (!) 55 67  Resp:  13  Temp: 36.6 C (!) 36.4 C  SpO2: 96% 97%    Last Pain:  Vitals:   12/31/18 1222  TempSrc:   PainSc: (P) 0-No pain                 ADAMS, AMY A

## 2018-12-31 NOTE — Transfer of Care (Signed)
Immediate Anesthesia Transfer of Care Note  Patient: [name]  Procedure(s) Performed: ESOPHAGOGASTRODUODENOSCOPY (EGD) WITH PROPOFOL (N/A ) SAVORY DILATION (N/A ) BIOPSY  Patient Location: PACU  Anesthesia Type:MAC  Level of Consciousness: awake, alert , oriented and patient cooperative  Airway & Oxygen Therapy: Patient Spontanous Breathing  Post-op Assessment: Report given to RN and Post -op Vital signs reviewed and stable  Post vital signs: Reviewed and stable  Last Vitals:  Vitals Value Taken Time  BP 111/58 12/31/18 1222  Temp 36.4 C 12/31/18 1222  Pulse 67 12/31/18 1225  Resp 15 12/31/18 1225  SpO2 96 % 12/31/18 1225  Vitals shown include unvalidated device data.  Last Pain:  Vitals:   12/31/18 1141  TempSrc: Oral  PainSc: 5       Patients Stated Pain Goal: 6 (23/36/12 2449)  Complications: No apparent anesthesia complications

## 2018-12-31 NOTE — Progress Notes (Signed)
CC'D TO PCP °

## 2018-12-31 NOTE — Telephone Encounter (Signed)
Received fax from [facility], EGD w/possible dilation on 12/31/18 with Dr. Gala Romney is approved. Fax to be scanned to chart.

## 2018-12-31 NOTE — Anesthesia Preprocedure Evaluation (Signed)
Anesthesia Evaluation  Patient identified by MRN, date of birth, ID band Patient awake    Reviewed: Allergy & Precautions, NPO status , Patient's Chart, lab work & pertinent test results  History of Anesthesia Complications (+) PONV and history of anesthetic complications  Airway Mallampati: I  TM Distance: >3 FB Neck ROM: Limited    Dental no notable dental hx. (+) Teeth Intact   Pulmonary neg pulmonary ROS, former smoker,    Pulmonary exam normal breath sounds clear to auscultation       Cardiovascular Exercise Tolerance: Good hypertension, Pt. on medications and Pt. on home beta blockers negative cardio ROS Normal cardiovascular examI Rhythm:Regular Rate:Normal     Neuro/Psych Anxiety negative neurological ROS  negative psych ROS   GI/Hepatic Neg liver ROS, GERD  Medicated and Controlled,  Endo/Other  negative endocrine ROS  Renal/GU negative Renal ROS  negative genitourinary   Musculoskeletal  (+) Arthritis , Osteoarthritis,  S/p ACDF x 5 - now fused from c3-7   Abdominal   Peds negative pediatric ROS (+)  Hematology negative hematology ROS (+)   Anesthesia Other Findings   Reproductive/Obstetrics negative OB ROS                             Anesthesia Physical Anesthesia Plan  ASA: II  Anesthesia Plan: General   Post-op Pain Management:    Induction: Intravenous  PONV Risk Score and Plan: 3 and TIVA, Propofol infusion, Treatment may vary due to age or medical condition and Ondansetron  Airway Management Planned: Simple Face Mask and Nasal Cannula  Additional Equipment:   Intra-op Plan:   Post-operative Plan:   Informed Consent: I have reviewed the patients History and Physical, chart, labs and discussed the procedure including the risks, benefits and alternatives for the proposed anesthesia with the patient or authorized representative who has indicated his/her  understanding and acceptance.     Dental advisory given  Plan Discussed with: CRNA  Anesthesia Plan Comments: (Plan Full PPE use  Plan GA with GETA as needed -d/w pt-WTP withsame after Q&A)        Anesthesia Quick Evaluation

## 2018-12-31 NOTE — Op Note (Signed)
[facility] Patient Name: [name] Procedure Date: 12/31/2018 11:35 AM MRN: [id number] Date of Birth: 11/29/1950 Attending MD: Barney Drain MD, MD CSN: [id number] Age: 68 Admit Type: Outpatient Procedure:                Upper GI endoscopy WITH COLD FORCEPS                            BIOPSY/ESOPHAGEAL DILATION Indications:              Dysphagia Providers:                Barney Drain MD, MD, Janeece Riggers, RN, Raphael Gibney, Technician Referring MD:             Edwinna Areola. Hall MD Medicines:                Propofol per Anesthesia Complications:            No immediate complications. Estimated Blood Loss:     Estimated blood loss was minimal. Procedure:                Pre-Anesthesia Assessment:                           - Prior to the procedure, a History and Physical                            was performed, and patient medications and                            allergies were reviewed. The patient's tolerance of                            previous anesthesia was also reviewed. The risks                            and benefits of the procedure and the sedation                            options and risks were discussed with the patient.                            All questions were answered, and informed consent                            was obtained. Prior Anticoagulants: The patient has                            taken no previous anticoagulant or antiplatelet                            agents. ASA Grade Assessment: II - A patient with  mild systemic disease. After reviewing the risks                            and benefits, the patient was deemed in                            satisfactory condition to undergo the procedure.                            After obtaining informed consent, the endoscope was                            passed under direct vision. Throughout the                            procedure, the patient's  blood pressure, pulse, and                            oxygen saturations were monitored continuously. The                            [id number]-[id number] ([id number]) scope was introduced through the                            mouth, and advanced to the duodenal bulb. The upper                            GI endoscopy was accomplished without difficulty.                            The patient tolerated the procedure well. Scope In: 12:04:32 PM Scope Out: 12:12:20 PM Total Procedure Duration: 0 hours 7 minutes 48 seconds  Findings:      One benign-appearing, intrinsic moderate (circumferential scarring or       stenosis; an endoscope may pass) stenosis was found. This stenosis       measured 1.4 cm (inner diameter). The stenosis was traversed. A       guidewire was placed and the scope was withdrawn. Dilation was performed       with a Savary dilator with mild resistance at 15 mm, 16 mm and 17 mm.      Localized mild inflammation characterized by congestion (edema) and       erythema was found in the gastric antrum. Biopsies(2; BODY, 1: INCISURA,       2: ANTRUM) were taken with a cold forceps for Helicobacter pylori       testing.      A small non-bleeding diverticulum was found in the second portion of the       duodenum.      Localized mild inflammation characterized by congestion (edema) and       erythema was found in the duodenal bulb. Impression:               - Benign-appearing esophageal STRICTURE DUE TO GERD                           - Non-bleeding  duodenal diverticulum.                           - MILD GASTRITIS/Duodenitis. Moderate Sedation:      Per Anesthesia Care Recommendation:           - Patient has a contact number available for                            emergencies. The signs and symptoms of potential                            delayed complications were discussed with the                            patient. Return to normal activities tomorrow.                            Written  discharge instructions were provided to the                            patient.                           - Low fat diet. AVOID REFLUX TRIGGERS.                           - Continue present medications.                           - Await pathology results. CALL IN ONE MONTH IF                            SWALLOWING NOT IMPROVED.                           - Return to GI office in 4 months. Procedure Code(s):        --- Professional ---                           [phone], Esophagogastroduodenoscopy, flexible,                            transoral; with insertion of guide wire followed by                            passage of dilator(s) through esophagus over guide                            wire                           27078, 27, Esophagogastroduodenoscopy, flexible,                            transoral; with biopsy, single or multiple Diagnosis Code(s):        --- Professional ---  K22.2, Esophageal obstruction                           K29.70, Gastritis, unspecified, without bleeding                           K29.80, Duodenitis without bleeding                           R13.10, Dysphagia, unspecified                           K57.10, Diverticulosis of small intestine without                            perforation or abscess without bleeding CPT copyright 2019 [employer]. All rights reserved. The codes documented in this report are preliminary and upon coder review may  be revised to meet current compliance requirements. Barney Drain, MD Barney Drain MD, MD 12/31/2018 12:39:30 PM This report has been signed electronically. Number of Addenda: 0

## 2018-12-31 NOTE — H&P (Signed)
Primary Care Physician:  Celene Squibb, MD Primary Gastroenterologist:  Dr. Oneida Alar  Pre-Procedure History & Physical: HPI:  [name] is a 68 y.o. male here for DYSPHAGIA  Past Medical History:  Diagnosis Date  . Anxiety   . Arthritis   . BPH (benign prostatic hyperplasia)   . Cancer ([place])    SKIN  . Chronic neck pain    narcotics, work injury in the 1990s, s/p multiple procedures  . Constipation   . Difficulty sleeping   . GERD (gastroesophageal reflux disease)    not anymore  . H/O jaundice    AS CHILD  . Hypertension   . Nasal congestion   . Palpitations    "every once in a while"  . PONV (postoperative nausea and vomiting)     Past Surgical History:  Procedure Laterality Date  . ANTERIOR CERVICAL DECOMP/DISCECTOMY FUSION N/A 05/24/2018   Procedure: ANTERIOR CERVICAL DECOMPRESSION/DISCECTOMY FUSION, INTERBODY PROSTHESIS, PLATE/SCREWS CERVICAL THREE- CERVICAL FOUR; EXPLORE FUSION;  Surgeon: Newman Pies, MD;  Location: [facility];  Service: Neurosurgery;  Laterality: N/A;  . [place], 2010   C6-C7, C5-C6, C4-C5  . COLONOSCOPY WITH PROPOFOL N/A 07/24/2012   Procedure: Colonoscopy with propofol;  Surgeon: Danie Binder, MD;  [facility];  Service: Endoscopy;  Laterality: N/A;  in cecum at 0754, 63min total withdrawel time  . HERNIA REPAIR    . INGUINAL HERNIA REPAIR  11/08/2011   Procedure: HERNIA REPAIR INGUINAL ADULT;  Surgeon: Rolm Bookbinder, MD;  [facility];  Service: General;  Laterality: Right;  . KNEE SURGERY  2009   right - scope  . [place]   right - scope  . TONSILLECTOMY      Prior to Admission medications   Medication Sig Start Date End Date Taking? Authorizing Provider  alfuzosin (UROXATRAL) 10 MG 24 hr tablet Take 10 mg by mouth at bedtime.  09/26/11  Yes [provider]  ALPRAZolam Duanne Moron) 0.5 MG tablet Take 0.5 mg by mouth 2 (two) times daily as needed for anxiety.    Yes [provider]  lactulose (CHRONULAC) 10 GM/15ML solution Take 10 g by mouth 2 (two) times daily as needed for moderate constipation.    Yes [provider]  LINZESS 290 MCG CAPS capsule Take 290 mg by mouth every other day.  04/09/18  Yes [provider]  losartan (COZAAR) 25 MG tablet Take 25 mg by mouth daily.   Yes [provider]  Melatonin 10 MG TABS Take 20 mg by mouth at bedtime as needed (insomnia).    Yes [provider]  Multiple Vitamin (MULTIVITAMIN WITH MINERALS) TABS Take 1 tablet by mouth daily.   Yes [provider]  oxyCODONE-acetaminophen (PERCOCET) 10-325 MG tablet Take 1 tablet by mouth every 4 (four) hours as needed for pain. Patient taking differently: Take 1 tablet by mouth every 6 (six) hours as needed for pain.  05/25/18  Yes Newman Pies, MD  pregabalin (LYRICA) 100 MG capsule Take 100 mg by mouth 2 (two) times daily.  05/04/18  Yes [provider]  propranolol (INDERAL) 20 MG tablet Take 20 mg by mouth 2 (two) times a day.  06/21/12  Yes [provider]  zolpidem (AMBIEN CR) 12.5 MG CR tablet Take 12.5 mg by mouth at bedtime.    Yes [provider]    Allergies as of 12/19/2018 - Review Complete 12/19/2018  Allergen Reaction Noted  . Penicillins Hives 10/17/2011  Family History  Problem Relation Age of Onset  . Colon cancer Neg Hx     Social History   Socioeconomic History  . Marital status: Divorced    Spouse name: Not on file  . Number of children: Not on file  . Years of education: Not on file  . Highest education level: Not on file  Occupational History  . Not on file  Social Needs  . Financial resource strain: Not on file  . Food insecurity    Worry: Not on file    Inability: Not on file  . Transportation needs    Medical: Not on file    Non-medical: Not on file  Tobacco Use  . Smoking status: Former Smoker    Quit date: 10/16/1993    Years since quitting: 25.2  . Smokeless  tobacco: Never Used  Substance and Sexual Activity  . Alcohol use: No  . Drug use: No  . Sexual activity: Not on file  Lifestyle  . Physical activity    Days per week: Not on file    Minutes per session: Not on file  . Stress: Not on file  Relationships  . Social Herbalist on phone: Not on file    Gets together: Not on file    Attends religious service: Not on file    Active member of club or organization: Not on file    Attends meetings of clubs or organizations: Not on file    Relationship status: Not on file  . Intimate partner violence    Fear of current or ex partner: Not on file    Emotionally abused: Not on file    Physically abused: Not on file    Forced sexual activity: Not on file  Other Topics Concern  . Not on file  Social History Narrative  . Not on file    Review of Systems: See HPI, otherwise negative ROS   Physical Exam: There were no vitals taken for this visit. General:   Alert,  pleasant and cooperative in NAD Head:  Normocephalic and atraumatic. Neck:  Supple; Lungs:  Clear throughout to auscultation.    Heart:  Regular rate and rhythm. Abdomen:  Soft, nontender and nondistended. Normal bowel sounds, without guarding, and without rebound.   Neurologic:  Alert and  oriented x4;  grossly normal neurologically.  Impression/Plan:     DYSPHAGIA  PLAN:  EGD/DIL TODAY. DISCUSSED PROCEDURE, BENEFITS, & RISKS: < 1% chance of medication reaction, bleeding, perforation, ASPIRATION, or rupture of spleen/liver requiring surgery to fix it and missed polyps < 1 cm 10-20% of the time.

## 2019-01-01 ENCOUNTER — Telehealth: Payer: Self-pay | Admitting: Gastroenterology

## 2019-01-01 NOTE — Telephone Encounter (Signed)
Please call pt. His stomach Bx ARE NEGATIVE FOR H PYLORI GASTRITIS.   DRINK WATER TO KEEP YOUR URINE LIGHT YELLOW.  AVOID REFLUX TRIGGERS.   STRICTLY FOLLOW A LOW FAT DIET.   Please CALL IN ONE MONTH IF YOUR SWALLOWING SYMPTOMS ARE NOT IMPROVED.   FOLLOW UP IN 4 MOS.

## 2019-01-02 NOTE — Telephone Encounter (Signed)
Reminder in epic °

## 2019-01-03 ENCOUNTER — Encounter: Payer: Self-pay | Admitting: Gastroenterology

## 2019-01-03 NOTE — Telephone Encounter (Signed)
SCHEDULED AND LETTER SENT  °

## 2019-01-04 NOTE — Telephone Encounter (Signed)
LMOM to call.

## 2019-01-07 DIAGNOSIS — F339 Major depressive disorder, recurrent, unspecified: Secondary | ICD-10-CM | POA: Diagnosis not present

## 2019-01-07 DIAGNOSIS — R251 Tremor, unspecified: Secondary | ICD-10-CM | POA: Diagnosis not present

## 2019-01-07 DIAGNOSIS — I10 Essential (primary) hypertension: Secondary | ICD-10-CM | POA: Diagnosis not present

## 2019-01-07 DIAGNOSIS — G629 Polyneuropathy, unspecified: Secondary | ICD-10-CM | POA: Diagnosis not present

## 2019-01-07 DIAGNOSIS — F331 Major depressive disorder, recurrent, moderate: Secondary | ICD-10-CM | POA: Diagnosis not present

## 2019-01-07 DIAGNOSIS — R3915 Urgency of urination: Secondary | ICD-10-CM | POA: Diagnosis not present

## 2019-01-07 DIAGNOSIS — F419 Anxiety disorder, unspecified: Secondary | ICD-10-CM | POA: Diagnosis not present

## 2019-01-07 DIAGNOSIS — G8929 Other chronic pain: Secondary | ICD-10-CM | POA: Diagnosis not present

## 2019-01-07 NOTE — Telephone Encounter (Signed)
Pt is aware of results. 

## 2019-01-08 ENCOUNTER — Encounter (HOSPITAL_COMMUNITY): Payer: Self-pay | Admitting: Gastroenterology

## 2019-01-22 DIAGNOSIS — F419 Anxiety disorder, unspecified: Secondary | ICD-10-CM | POA: Diagnosis not present

## 2019-01-22 DIAGNOSIS — G4700 Insomnia, unspecified: Secondary | ICD-10-CM | POA: Diagnosis not present

## 2019-01-22 DIAGNOSIS — I10 Essential (primary) hypertension: Secondary | ICD-10-CM | POA: Diagnosis not present

## 2019-01-22 DIAGNOSIS — F331 Major depressive disorder, recurrent, moderate: Secondary | ICD-10-CM | POA: Diagnosis not present

## 2019-01-22 DIAGNOSIS — F339 Major depressive disorder, recurrent, unspecified: Secondary | ICD-10-CM | POA: Diagnosis not present

## 2019-01-22 DIAGNOSIS — G629 Polyneuropathy, unspecified: Secondary | ICD-10-CM | POA: Diagnosis not present

## 2019-01-22 DIAGNOSIS — Z125 Encounter for screening for malignant neoplasm of prostate: Secondary | ICD-10-CM | POA: Diagnosis not present

## 2019-01-22 DIAGNOSIS — F411 Generalized anxiety disorder: Secondary | ICD-10-CM | POA: Diagnosis not present

## 2019-01-22 DIAGNOSIS — Z0000 Encounter for general adult medical examination without abnormal findings: Secondary | ICD-10-CM | POA: Diagnosis not present

## 2019-01-22 DIAGNOSIS — Z0001 Encounter for general adult medical examination with abnormal findings: Secondary | ICD-10-CM | POA: Diagnosis not present

## 2019-01-22 DIAGNOSIS — R251 Tremor, unspecified: Secondary | ICD-10-CM | POA: Diagnosis not present

## 2019-01-22 DIAGNOSIS — G8929 Other chronic pain: Secondary | ICD-10-CM | POA: Diagnosis not present

## 2019-01-22 DIAGNOSIS — N400 Enlarged prostate without lower urinary tract symptoms: Secondary | ICD-10-CM | POA: Diagnosis not present

## 2019-02-07 DIAGNOSIS — F419 Anxiety disorder, unspecified: Secondary | ICD-10-CM | POA: Diagnosis not present

## 2019-02-07 DIAGNOSIS — G8929 Other chronic pain: Secondary | ICD-10-CM | POA: Diagnosis not present

## 2019-02-07 DIAGNOSIS — R251 Tremor, unspecified: Secondary | ICD-10-CM | POA: Diagnosis not present

## 2019-02-07 DIAGNOSIS — R3915 Urgency of urination: Secondary | ICD-10-CM | POA: Diagnosis not present

## 2019-02-07 DIAGNOSIS — G629 Polyneuropathy, unspecified: Secondary | ICD-10-CM | POA: Diagnosis not present

## 2019-02-07 DIAGNOSIS — F339 Major depressive disorder, recurrent, unspecified: Secondary | ICD-10-CM | POA: Diagnosis not present

## 2019-02-07 DIAGNOSIS — I10 Essential (primary) hypertension: Secondary | ICD-10-CM | POA: Diagnosis not present

## 2019-02-07 DIAGNOSIS — F331 Major depressive disorder, recurrent, moderate: Secondary | ICD-10-CM | POA: Diagnosis not present

## 2019-03-08 DIAGNOSIS — I10 Essential (primary) hypertension: Secondary | ICD-10-CM | POA: Diagnosis not present

## 2019-03-08 DIAGNOSIS — G629 Polyneuropathy, unspecified: Secondary | ICD-10-CM | POA: Diagnosis not present

## 2019-03-08 DIAGNOSIS — R251 Tremor, unspecified: Secondary | ICD-10-CM | POA: Diagnosis not present

## 2019-03-08 DIAGNOSIS — R3915 Urgency of urination: Secondary | ICD-10-CM | POA: Diagnosis not present

## 2019-03-08 DIAGNOSIS — G8929 Other chronic pain: Secondary | ICD-10-CM | POA: Diagnosis not present

## 2019-03-08 DIAGNOSIS — F339 Major depressive disorder, recurrent, unspecified: Secondary | ICD-10-CM | POA: Diagnosis not present

## 2019-03-08 DIAGNOSIS — F331 Major depressive disorder, recurrent, moderate: Secondary | ICD-10-CM | POA: Diagnosis not present

## 2019-03-08 DIAGNOSIS — F419 Anxiety disorder, unspecified: Secondary | ICD-10-CM | POA: Diagnosis not present

## 2019-04-08 ENCOUNTER — Telehealth: Payer: Self-pay | Admitting: General Practice

## 2019-04-08 NOTE — Telephone Encounter (Signed)
-----   Message from Derrick Ravel, [employer] sent at 04/08/2019  4:02 PM EST ----- Received a call from Richrd Humbles, a nurse with [employer].  Per Keith Rake [phone], they were only covering pt under workers comp for his procedure  after being ref by Dr. Arnoldo Morale and they're no longer covering any more office visits. Pt will continue to be seen in our office for Jerrye Bushy and it won't be workers comp claim.   I'm not sure where in the chart to put this or if it just needs to go in a telephone note. Please advise.

## 2019-04-08 NOTE — Telephone Encounter (Signed)
Noted  

## 2019-04-09 DIAGNOSIS — F419 Anxiety disorder, unspecified: Secondary | ICD-10-CM | POA: Diagnosis not present

## 2019-04-09 DIAGNOSIS — F339 Major depressive disorder, recurrent, unspecified: Secondary | ICD-10-CM | POA: Diagnosis not present

## 2019-04-09 DIAGNOSIS — R251 Tremor, unspecified: Secondary | ICD-10-CM | POA: Diagnosis not present

## 2019-04-09 DIAGNOSIS — F331 Major depressive disorder, recurrent, moderate: Secondary | ICD-10-CM | POA: Diagnosis not present

## 2019-04-09 DIAGNOSIS — G629 Polyneuropathy, unspecified: Secondary | ICD-10-CM | POA: Diagnosis not present

## 2019-04-09 DIAGNOSIS — G8929 Other chronic pain: Secondary | ICD-10-CM | POA: Diagnosis not present

## 2019-04-09 DIAGNOSIS — R3915 Urgency of urination: Secondary | ICD-10-CM | POA: Diagnosis not present

## 2019-04-09 DIAGNOSIS — I10 Essential (primary) hypertension: Secondary | ICD-10-CM | POA: Diagnosis not present

## 2019-04-10 DIAGNOSIS — H26491 Other secondary cataract, right eye: Secondary | ICD-10-CM | POA: Diagnosis not present

## 2019-04-10 DIAGNOSIS — H52203 Unspecified astigmatism, bilateral: Secondary | ICD-10-CM | POA: Diagnosis not present

## 2019-04-10 DIAGNOSIS — H2512 Age-related nuclear cataract, left eye: Secondary | ICD-10-CM | POA: Diagnosis not present

## 2019-04-23 DIAGNOSIS — N5201 Erectile dysfunction due to arterial insufficiency: Secondary | ICD-10-CM | POA: Diagnosis not present

## 2019-04-23 DIAGNOSIS — R972 Elevated prostate specific antigen [PSA]: Secondary | ICD-10-CM | POA: Diagnosis not present

## 2019-04-29 DIAGNOSIS — H6690 Otitis media, unspecified, unspecified ear: Secondary | ICD-10-CM | POA: Diagnosis not present

## 2019-04-29 DIAGNOSIS — H8110 Benign paroxysmal vertigo, unspecified ear: Secondary | ICD-10-CM | POA: Diagnosis not present

## 2019-05-08 ENCOUNTER — Ambulatory Visit: Payer: Medicare HMO | Admitting: Gastroenterology

## 2019-05-09 DIAGNOSIS — H26491 Other secondary cataract, right eye: Secondary | ICD-10-CM | POA: Diagnosis not present

## 2019-05-14 DIAGNOSIS — G629 Polyneuropathy, unspecified: Secondary | ICD-10-CM | POA: Diagnosis not present

## 2019-05-14 DIAGNOSIS — F331 Major depressive disorder, recurrent, moderate: Secondary | ICD-10-CM | POA: Diagnosis not present

## 2019-05-14 DIAGNOSIS — F419 Anxiety disorder, unspecified: Secondary | ICD-10-CM | POA: Diagnosis not present

## 2019-05-14 DIAGNOSIS — F339 Major depressive disorder, recurrent, unspecified: Secondary | ICD-10-CM | POA: Diagnosis not present

## 2019-05-14 DIAGNOSIS — R251 Tremor, unspecified: Secondary | ICD-10-CM | POA: Diagnosis not present

## 2019-05-14 DIAGNOSIS — I10 Essential (primary) hypertension: Secondary | ICD-10-CM | POA: Diagnosis not present

## 2019-05-14 DIAGNOSIS — R3915 Urgency of urination: Secondary | ICD-10-CM | POA: Diagnosis not present

## 2019-05-14 DIAGNOSIS — G8929 Other chronic pain: Secondary | ICD-10-CM | POA: Diagnosis not present

## 2019-06-17 DIAGNOSIS — F331 Major depressive disorder, recurrent, moderate: Secondary | ICD-10-CM | POA: Diagnosis not present

## 2019-06-17 DIAGNOSIS — I10 Essential (primary) hypertension: Secondary | ICD-10-CM | POA: Diagnosis not present

## 2019-06-17 DIAGNOSIS — G8929 Other chronic pain: Secondary | ICD-10-CM | POA: Diagnosis not present

## 2019-06-17 DIAGNOSIS — R251 Tremor, unspecified: Secondary | ICD-10-CM | POA: Diagnosis not present

## 2019-06-17 DIAGNOSIS — F339 Major depressive disorder, recurrent, unspecified: Secondary | ICD-10-CM | POA: Diagnosis not present

## 2019-06-17 DIAGNOSIS — R3915 Urgency of urination: Secondary | ICD-10-CM | POA: Diagnosis not present

## 2019-06-17 DIAGNOSIS — G629 Polyneuropathy, unspecified: Secondary | ICD-10-CM | POA: Diagnosis not present

## 2019-06-17 DIAGNOSIS — F419 Anxiety disorder, unspecified: Secondary | ICD-10-CM | POA: Diagnosis not present

## 2019-06-20 DIAGNOSIS — C44319 Basal cell carcinoma of skin of other parts of face: Secondary | ICD-10-CM | POA: Diagnosis not present

## 2019-06-20 DIAGNOSIS — L570 Actinic keratosis: Secondary | ICD-10-CM | POA: Diagnosis not present

## 2019-06-20 DIAGNOSIS — D485 Neoplasm of uncertain behavior of skin: Secondary | ICD-10-CM | POA: Diagnosis not present

## 2019-06-20 DIAGNOSIS — X32XXXD Exposure to sunlight, subsequent encounter: Secondary | ICD-10-CM | POA: Diagnosis not present

## 2019-06-20 DIAGNOSIS — D225 Melanocytic nevi of trunk: Secondary | ICD-10-CM | POA: Diagnosis not present

## 2019-07-09 DIAGNOSIS — G629 Polyneuropathy, unspecified: Secondary | ICD-10-CM | POA: Diagnosis not present

## 2019-07-09 DIAGNOSIS — F4542 Pain disorder with related psychological factors: Secondary | ICD-10-CM | POA: Diagnosis not present

## 2019-07-09 DIAGNOSIS — R251 Tremor, unspecified: Secondary | ICD-10-CM | POA: Diagnosis not present

## 2019-07-09 DIAGNOSIS — I10 Essential (primary) hypertension: Secondary | ICD-10-CM | POA: Diagnosis not present

## 2019-07-09 DIAGNOSIS — F331 Major depressive disorder, recurrent, moderate: Secondary | ICD-10-CM | POA: Diagnosis not present

## 2019-07-09 DIAGNOSIS — G8929 Other chronic pain: Secondary | ICD-10-CM | POA: Diagnosis not present

## 2019-07-09 DIAGNOSIS — F339 Major depressive disorder, recurrent, unspecified: Secondary | ICD-10-CM | POA: Diagnosis not present

## 2019-07-09 DIAGNOSIS — R3915 Urgency of urination: Secondary | ICD-10-CM | POA: Diagnosis not present

## 2019-07-09 DIAGNOSIS — F419 Anxiety disorder, unspecified: Secondary | ICD-10-CM | POA: Diagnosis not present

## 2019-08-02 ENCOUNTER — Other Ambulatory Visit: Payer: Self-pay

## 2019-08-02 ENCOUNTER — Encounter: Payer: Self-pay | Admitting: Gastroenterology

## 2019-08-02 ENCOUNTER — Ambulatory Visit: Payer: Medicare HMO | Admitting: Gastroenterology

## 2019-08-02 VITALS — BP 152/88 | HR 59 | Temp 96.9°F | Ht 72.0 in | Wt 198.6 lb

## 2019-08-02 DIAGNOSIS — R1310 Dysphagia, unspecified: Secondary | ICD-10-CM

## 2019-08-02 NOTE — Patient Instructions (Signed)
1. Barium xray as scheduled. We will contact you with results as available.

## 2019-08-02 NOTE — Progress Notes (Signed)
Primary Care Physician: Celene Squibb, MD  Primary Gastroenterologist:  Barney Drain, MD   Chief Complaint  Patient presents with  . Dysphagia    Food and medications getting stuck in throat    HPI: [name] is a 69 y.o. male here for follow-up dysphagia.  Seen back in July for the same.  Reports dysphagia started after neck surgery in December 2019.  EGD July 2020: Benign-appearing esophageal STRICTURE DUE TO GERD status post dilation, Non-bleeding duodenal diverticulum, MILD GASTRITIS/Duodenitis.  Gastric biopsy benign, no H. pylori.  Colonoscopy February 2014: 5 colon polyps removed, mild diverticulosis, small internal hemorrhoids.  Pathology showed a simple adenoma.  Other polyps were hyperplastic.  Advised colonoscopy in 10 years.  Patient states esophageal dilation did not help his swallowing issues.  With liquids he has to take small sips.  Prior to his neck surgery he was able to take large gulps.  When he takes pills they feel like they are sticking in the back of the throat, they dissolve and taste awful.  Solid foods, particularly meats get lodged in the neck area.  Often feels like he is choking, has to cough forcefully to get relief.  Sometimes the food comes back up.  In the past he has had issues with reflux but has not had any problems in quite some time.  Denies regurgitation or heartburn.  No weight loss.  Bowel movements are regular.  No blood in stool or melena.     Current Outpatient Medications  Medication Sig Dispense Refill  . alfuzosin (UROXATRAL) 10 MG 24 hr tablet Take 10 mg by mouth at bedtime.     . ALPRAZolam (XANAX) 0.5 MG tablet Take 0.5 mg by mouth 2 (two) times daily as needed for anxiety.     Marland Kitchen lactulose (CHRONULAC) 10 GM/15ML solution Take 10 g by mouth 2 (two) times daily as needed for moderate constipation.     Marland Kitchen LINZESS 290 MCG CAPS capsule Take 290 mg by mouth every other day.   2  . losartan (COZAAR) 25 MG tablet Take 25 mg by mouth  daily.    . Melatonin 10 MG TABS Take 20 mg by mouth at bedtime as needed (insomnia).     . Multiple Vitamin (MULTIVITAMIN WITH MINERALS) TABS Take 1 tablet by mouth daily.    Marland Kitchen oxyCODONE-acetaminophen (PERCOCET) 10-325 MG tablet Take 1 tablet by mouth every 4 (four) hours as needed for pain. (Patient taking differently: Take 1 tablet by mouth every 6 (six) hours as needed for pain. ) 50 tablet 0  . pregabalin (LYRICA) 100 MG capsule Take 100 mg by mouth 2 (two) times daily.   1  . propranolol (INDERAL) 20 MG tablet Take 20 mg by mouth 2 (two) times a day.     . zolpidem (AMBIEN CR) 12.5 MG CR tablet Take 12.5 mg by mouth at bedtime.      No current facility-administered medications for this visit.    Allergies as of 08/02/2019 - Review Complete 08/02/2019  Allergen Reaction Noted  . Penicillins Hives 10/17/2011    ROS:  General: Negative for anorexia, weight loss, fever, chills, fatigue, weakness. ENT: Negative for hoarseness, difficulty swallowing , nasal congestion. CV: Negative for chest pain, angina, palpitations, dyspnea on exertion, peripheral edema.  Respiratory: Negative for dyspnea at rest, dyspnea on exertion, cough, sputum, wheezing.  GI: See history of present illness. GU:  Negative for dysuria, hematuria, urinary incontinence, urinary frequency, nocturnal urination.  Endo:  Negative for unusual weight change.    Physical Examination:   BP (!) 152/88 (BP Location: Left Arm, Cuff Size: Normal)   Pulse (!) 59   Temp (!) 96.9 F (36.1 C) (Temporal)   Ht 6' (1.829 m)   Wt 198 lb 9.6 oz (90.1 kg)   BMI 26.94 kg/m   General: Well-nourished, well-developed in no acute distress.  Eyes: No icterus. Mouth:masked Lungs: Clear to auscultation bilaterally.  Heart: Regular rate and rhythm, no murmurs rubs or gallops.  Abdomen: Bowel sounds are normal, nontender, nondistended, no hepatosplenomegaly or masses, no abdominal bruits or hernia , no rebound or guarding.     Extremities: No lower extremity edema. No clubbing or deformities. Neuro: Alert and oriented x 4   Skin: Warm and dry, no jaundice.   Psych: Alert and cooperative, normal mood and affect.   Imaging Studies: No results found.

## 2019-08-02 NOTE — Assessment & Plan Note (Signed)
Persistent dysphagia status post esophageal dilation of stricture.  I suspect some of his swallowing issues are related to oral pharyngeal component but given prior EGD findings, we need to exclude persistent esophageal stricture.  Plan for barium pill esophagram in the near future.  If unremarkable, next step would be modified barium swallow study.  Currently patient is not on a PPI.  We discussed potentially starting a PPI in the near future pending results currently he has no heartburn or regurgitation and prefers not to start medication.

## 2019-08-08 ENCOUNTER — Other Ambulatory Visit: Payer: Self-pay

## 2019-08-08 ENCOUNTER — Ambulatory Visit (HOSPITAL_COMMUNITY)
Admission: RE | Admit: 2019-08-08 | Discharge: 2019-08-08 | Disposition: A | Discharge status: Home / Self Care | Payer: Medicare HMO | Source: Ambulatory Visit | Attending: Gastroenterology | Admitting: Gastroenterology

## 2019-08-08 DIAGNOSIS — K224 Dyskinesia of esophagus: Secondary | ICD-10-CM | POA: Diagnosis not present

## 2019-08-08 DIAGNOSIS — R1310 Dysphagia, unspecified: Secondary | ICD-10-CM

## 2019-08-08 DIAGNOSIS — I10 Essential (primary) hypertension: Secondary | ICD-10-CM | POA: Diagnosis not present

## 2019-08-08 DIAGNOSIS — F331 Major depressive disorder, recurrent, moderate: Secondary | ICD-10-CM | POA: Diagnosis not present

## 2019-08-08 DIAGNOSIS — R251 Tremor, unspecified: Secondary | ICD-10-CM | POA: Diagnosis not present

## 2019-08-08 DIAGNOSIS — R3915 Urgency of urination: Secondary | ICD-10-CM | POA: Diagnosis not present

## 2019-08-08 DIAGNOSIS — F339 Major depressive disorder, recurrent, unspecified: Secondary | ICD-10-CM | POA: Diagnosis not present

## 2019-08-08 DIAGNOSIS — G629 Polyneuropathy, unspecified: Secondary | ICD-10-CM | POA: Diagnosis not present

## 2019-08-08 DIAGNOSIS — G8929 Other chronic pain: Secondary | ICD-10-CM | POA: Diagnosis not present

## 2019-08-08 DIAGNOSIS — F419 Anxiety disorder, unspecified: Secondary | ICD-10-CM | POA: Diagnosis not present

## 2019-08-09 ENCOUNTER — Other Ambulatory Visit: Payer: Self-pay | Admitting: *Deleted

## 2019-08-09 DIAGNOSIS — R1310 Dysphagia, unspecified: Secondary | ICD-10-CM

## 2019-08-12 ENCOUNTER — Other Ambulatory Visit (HOSPITAL_COMMUNITY): Payer: Self-pay | Admitting: Specialist

## 2019-08-12 DIAGNOSIS — R1319 Other dysphagia: Secondary | ICD-10-CM

## 2019-08-12 DIAGNOSIS — Z8719 Personal history of other diseases of the digestive system: Secondary | ICD-10-CM

## 2019-08-13 ENCOUNTER — Telehealth: Payer: Self-pay | Admitting: Gastroenterology

## 2019-08-13 NOTE — Telephone Encounter (Signed)
Pt said he had a swallowing test done last week and was checking on the results. [phone]

## 2019-08-13 NOTE — Telephone Encounter (Signed)
Called pt back and reviewed his results again with him.  He said he basically was calling to ask questions about the referral we made.  He said that they contacted him but he forgot where to go.  All questions were answered.

## 2019-08-14 ENCOUNTER — Ambulatory Visit (HOSPITAL_COMMUNITY): Payer: Medicare HMO | Attending: Gastroenterology | Admitting: Speech Pathology

## 2019-08-14 ENCOUNTER — Other Ambulatory Visit: Payer: Self-pay

## 2019-08-14 ENCOUNTER — Encounter (HOSPITAL_COMMUNITY): Payer: Self-pay

## 2019-08-14 ENCOUNTER — Ambulatory Visit (HOSPITAL_COMMUNITY)
Admission: RE | Admit: 2019-08-14 | Discharge: 2019-08-14 | Disposition: A | Discharge status: Home / Self Care | Payer: Medicare HMO | Source: Ambulatory Visit | Attending: Gastroenterology | Admitting: Gastroenterology

## 2019-08-14 DIAGNOSIS — Z8719 Personal history of other diseases of the digestive system: Secondary | ICD-10-CM | POA: Diagnosis not present

## 2019-08-14 DIAGNOSIS — R1319 Other dysphagia: Secondary | ICD-10-CM | POA: Diagnosis not present

## 2019-08-14 DIAGNOSIS — R1310 Dysphagia, unspecified: Secondary | ICD-10-CM | POA: Diagnosis not present

## 2019-08-14 DIAGNOSIS — R1312 Dysphagia, oropharyngeal phase: Secondary | ICD-10-CM | POA: Diagnosis not present

## 2019-08-14 DIAGNOSIS — T17300A Unspecified foreign body in larynx causing asphyxiation, initial encounter: Secondary | ICD-10-CM | POA: Diagnosis not present

## 2019-08-15 ENCOUNTER — Encounter (HOSPITAL_COMMUNITY): Payer: Self-pay | Admitting: Speech Pathology

## 2019-08-15 NOTE — Therapy (Signed)
[facility] [place], [place], [address] Phone: [phone]   Fax:  [phone]  Modified Barium Swallow  Patient Details  Name: [name] MRN: [id number] Date of Birth: 1951-04-28 No data recorded  Encounter Date: 08/14/2019    Past Medical History:  Diagnosis Date  . Anxiety   . Arthritis   . BPH (benign prostatic hyperplasia)   . Cancer ([place])    SKIN  . Chronic neck pain    narcotics, work injury in the 1990s, s/p multiple procedures  . Constipation   . Difficulty sleeping   . GERD (gastroesophageal reflux disease)    not anymore  . H/O jaundice    AS CHILD  . Hypertension   . Nasal congestion   . Palpitations    "every once in a while"  . PONV (postoperative nausea and vomiting)     Past Surgical History:  Procedure Laterality Date  . ANTERIOR CERVICAL DECOMP/DISCECTOMY FUSION N/A 05/24/2018   Procedure: ANTERIOR CERVICAL DECOMPRESSION/DISCECTOMY FUSION, INTERBODY PROSTHESIS, PLATE/SCREWS CERVICAL THREE- CERVICAL FOUR; EXPLORE FUSION;  Surgeon: Newman Pies, MD;  Location: [facility];  Service: Neurosurgery;  Laterality: N/A;  . BIOPSY  12/31/2018   Procedure: BIOPSY;  Surgeon: Danie Binder, MD;  [facility];  Service: Endoscopy;;  . [place], 2010   C6-C7, C5-C6, C4-C5  . COLONOSCOPY WITH PROPOFOL N/A 07/24/2012   Dr. Oneida Alar: 5 colon polyps removed, one simple adenoma the other is hyperplastic, mild diverticulosis, small internal hemorrhoids.  Next colonoscopy 10 years  . ESOPHAGOGASTRODUODENOSCOPY (EGD) WITH PROPOFOL N/A 12/31/2018   Dr. Oneida Alar: Benign-appearing esophageal stricture, nonbleeding duodenal diverticulum, mild gastritis/duodenitis.  Esophagus dilated.  Gastric biopsy benign, no H. pylori.  Marland Kitchen HERNIA REPAIR    . INGUINAL HERNIA REPAIR  11/08/2011   Procedure: HERNIA REPAIR INGUINAL ADULT;  Surgeon: Rolm Bookbinder, MD;  [facility];  Service: General;   Laterality: Right;  . KNEE SURGERY  2009   right - scope  . SAVORY DILATION N/A 12/31/2018   Procedure: SAVORY DILATION;  Surgeon: Danie Binder, MD;  [facility];  Service: Endoscopy;  Laterality: N/A;  . [place]   right - scope  . TONSILLECTOMY     HPI: [name] is a 69 y.o. male who was referred for MBSS by Neil Crouch PA.   Pt reports dysphagia started after neck surgery in December 2019. EGD July 2020: Benign-appearing esophageal STRICTURE DUE TO GERD status post dilation, Non-bleeding duodenal diverticulum, MILD GASTRITIS/Duodenitis.  Gastric biopsy benign, no H. pylori. Patient states esophageal dilation did not help his swallowing issues.  With liquids he has to take small sips.  Prior to his neck surgery he was able to take large gulps.  When he takes pills they feel like they are sticking in the back of the throat, they dissolve and taste awful.  Solid foods, particularly meats get lodged in the neck area.  Often feels like he is choking, has to cough forcefully to get relief.  Sometimes the food comes back up.  In the past he has had issues with reflux but has not had any problems in quite some time. Pt had barium swallow on 08/08/2019 which showed: Hypopharynx/cervical esophagus: Mild laryngeal penetration. No aspiration. Vallecular and piriform sinus residuals identified. Prior anterior cervical fusion of C3-C6.  There were no vitals filed for this visit.  Subjective Assessment - 08/15/19 1558    Subjective  "I've  had trouble ever since my neck surgeries."    Special Tests  MBSS    Currently in Pain?  No/denies      Pt was seen for MBSS and assessed with barium tinged thin (tsp/cup/straw), puree, regular textures, and barium tablet with cup thin. Pt presents with mild/mod pharyngeal phase dysphagia characterized by reduced tongue base retraction, epiglottic deflection, and pharyngeal stripping resulting in min residuals with liquids post swallow in  the valleculae and pyriforms and moderate vallecular residuals with puree and regular textures. Head turns were ineffective in reducing vallecular residuals, however cues to "swallow fast and hard" were effective in reducing, but not eliminating vallecular residuals. Pt had a single episode of flash penetration of thins (under epiglottic coating only) over 4 sips. Pt with cervical hardware noted C3-6). This study was reviewed with Pt and he was provided with written instructions for effortful/hard swallows on all solids and semi solids and to swallow 2x for each bite/sip. Pt may benefit from trial period of dysphagia therapy to work on pharyngeal strengthening. He was given OP contact information if he wishes to pursue.     Patient will benefit from skilled therapeutic intervention in order to improve the following deficits and impairments:   Dysphagia, oropharyngeal phase        Problem List Patient Active Problem List   Diagnosis Date Noted  . Dysphagia 12/18/2018  . Cervical spondylosis with radiculopathy 05/24/2018  . Encounter for screening colonoscopy 07/11/2012  . SHOULDER PAIN 12/05/2007  . IMPINGEMENT SYNDROME 12/05/2007  . DEGENERATIVE JOINT DISEASE, KNEE 02/22/2007  . TEAR MEDIAL MENISCUS 02/22/2007   Thank you,  Genene Churn, [place]  [facility] 08/14/2019, 4:08 PM  [facility] [address] [place], [place], [address] Phone: [phone]   Fax:  [phone]  Name: [name] MRN: [id number] Date of Birth: 09-02-1950

## 2019-09-02 ENCOUNTER — Encounter: Payer: Self-pay | Admitting: Gastroenterology

## 2019-09-02 NOTE — Progress Notes (Signed)
PATIENT SCHEDULED  °

## 2019-09-11 DIAGNOSIS — G629 Polyneuropathy, unspecified: Secondary | ICD-10-CM | POA: Diagnosis not present

## 2019-09-11 DIAGNOSIS — F419 Anxiety disorder, unspecified: Secondary | ICD-10-CM | POA: Diagnosis not present

## 2019-09-11 DIAGNOSIS — R3915 Urgency of urination: Secondary | ICD-10-CM | POA: Diagnosis not present

## 2019-09-11 DIAGNOSIS — F331 Major depressive disorder, recurrent, moderate: Secondary | ICD-10-CM | POA: Diagnosis not present

## 2019-09-11 DIAGNOSIS — R251 Tremor, unspecified: Secondary | ICD-10-CM | POA: Diagnosis not present

## 2019-09-11 DIAGNOSIS — I10 Essential (primary) hypertension: Secondary | ICD-10-CM | POA: Diagnosis not present

## 2019-09-11 DIAGNOSIS — F339 Major depressive disorder, recurrent, unspecified: Secondary | ICD-10-CM | POA: Diagnosis not present

## 2019-09-11 DIAGNOSIS — G8929 Other chronic pain: Secondary | ICD-10-CM | POA: Diagnosis not present

## 2019-09-17 DIAGNOSIS — R7301 Impaired fasting glucose: Secondary | ICD-10-CM | POA: Diagnosis not present

## 2019-09-17 DIAGNOSIS — M5030 Other cervical disc degeneration, unspecified cervical region: Secondary | ICD-10-CM | POA: Diagnosis not present

## 2019-09-17 DIAGNOSIS — Z6838 Body mass index (BMI) 38.0-38.9, adult: Secondary | ICD-10-CM | POA: Diagnosis not present

## 2019-09-17 DIAGNOSIS — F419 Anxiety disorder, unspecified: Secondary | ICD-10-CM | POA: Diagnosis not present

## 2019-09-17 DIAGNOSIS — Z0001 Encounter for general adult medical examination with abnormal findings: Secondary | ICD-10-CM | POA: Diagnosis not present

## 2019-09-17 DIAGNOSIS — G4700 Insomnia, unspecified: Secondary | ICD-10-CM | POA: Diagnosis not present

## 2019-09-17 DIAGNOSIS — G629 Polyneuropathy, unspecified: Secondary | ICD-10-CM | POA: Diagnosis not present

## 2019-09-17 DIAGNOSIS — F331 Major depressive disorder, recurrent, moderate: Secondary | ICD-10-CM | POA: Diagnosis not present

## 2019-09-17 DIAGNOSIS — F339 Major depressive disorder, recurrent, unspecified: Secondary | ICD-10-CM | POA: Diagnosis not present

## 2019-09-17 DIAGNOSIS — Z712 Person consulting for explanation of examination or test findings: Secondary | ICD-10-CM | POA: Diagnosis not present

## 2019-09-17 DIAGNOSIS — R251 Tremor, unspecified: Secondary | ICD-10-CM | POA: Diagnosis not present

## 2019-09-17 DIAGNOSIS — F411 Generalized anxiety disorder: Secondary | ICD-10-CM | POA: Diagnosis not present

## 2019-09-17 DIAGNOSIS — G8929 Other chronic pain: Secondary | ICD-10-CM | POA: Diagnosis not present

## 2019-09-17 DIAGNOSIS — Z0000 Encounter for general adult medical examination without abnormal findings: Secondary | ICD-10-CM | POA: Diagnosis not present

## 2019-09-17 DIAGNOSIS — I10 Essential (primary) hypertension: Secondary | ICD-10-CM | POA: Diagnosis not present

## 2019-09-26 DIAGNOSIS — I10 Essential (primary) hypertension: Secondary | ICD-10-CM | POA: Diagnosis not present

## 2019-09-26 DIAGNOSIS — M5030 Other cervical disc degeneration, unspecified cervical region: Secondary | ICD-10-CM | POA: Diagnosis not present

## 2019-09-26 DIAGNOSIS — F331 Major depressive disorder, recurrent, moderate: Secondary | ICD-10-CM | POA: Diagnosis not present

## 2019-09-26 DIAGNOSIS — F411 Generalized anxiety disorder: Secondary | ICD-10-CM | POA: Diagnosis not present

## 2019-09-26 DIAGNOSIS — G4700 Insomnia, unspecified: Secondary | ICD-10-CM | POA: Diagnosis not present

## 2019-09-26 DIAGNOSIS — K5903 Drug induced constipation: Secondary | ICD-10-CM | POA: Diagnosis not present

## 2019-09-26 DIAGNOSIS — G629 Polyneuropathy, unspecified: Secondary | ICD-10-CM | POA: Diagnosis not present

## 2019-09-26 DIAGNOSIS — G250 Essential tremor: Secondary | ICD-10-CM | POA: Diagnosis not present

## 2019-09-26 DIAGNOSIS — N400 Enlarged prostate without lower urinary tract symptoms: Secondary | ICD-10-CM | POA: Diagnosis not present

## 2019-10-07 DIAGNOSIS — G8929 Other chronic pain: Secondary | ICD-10-CM | POA: Diagnosis not present

## 2019-10-07 DIAGNOSIS — F331 Major depressive disorder, recurrent, moderate: Secondary | ICD-10-CM | POA: Diagnosis not present

## 2019-10-07 DIAGNOSIS — F339 Major depressive disorder, recurrent, unspecified: Secondary | ICD-10-CM | POA: Diagnosis not present

## 2019-10-07 DIAGNOSIS — Z85828 Personal history of other malignant neoplasm of skin: Secondary | ICD-10-CM | POA: Diagnosis not present

## 2019-10-07 DIAGNOSIS — R251 Tremor, unspecified: Secondary | ICD-10-CM | POA: Diagnosis not present

## 2019-10-07 DIAGNOSIS — F419 Anxiety disorder, unspecified: Secondary | ICD-10-CM | POA: Diagnosis not present

## 2019-10-07 DIAGNOSIS — R3915 Urgency of urination: Secondary | ICD-10-CM | POA: Diagnosis not present

## 2019-10-07 DIAGNOSIS — L570 Actinic keratosis: Secondary | ICD-10-CM | POA: Diagnosis not present

## 2019-10-07 DIAGNOSIS — L308 Other specified dermatitis: Secondary | ICD-10-CM | POA: Diagnosis not present

## 2019-10-07 DIAGNOSIS — X32XXXD Exposure to sunlight, subsequent encounter: Secondary | ICD-10-CM | POA: Diagnosis not present

## 2019-10-07 DIAGNOSIS — G629 Polyneuropathy, unspecified: Secondary | ICD-10-CM | POA: Diagnosis not present

## 2019-10-07 DIAGNOSIS — Z08 Encounter for follow-up examination after completed treatment for malignant neoplasm: Secondary | ICD-10-CM | POA: Diagnosis not present

## 2019-10-07 DIAGNOSIS — L218 Other seborrheic dermatitis: Secondary | ICD-10-CM | POA: Diagnosis not present

## 2019-10-07 DIAGNOSIS — I10 Essential (primary) hypertension: Secondary | ICD-10-CM | POA: Diagnosis not present

## 2019-10-10 DIAGNOSIS — I10 Essential (primary) hypertension: Secondary | ICD-10-CM | POA: Diagnosis not present

## 2019-10-10 DIAGNOSIS — G629 Polyneuropathy, unspecified: Secondary | ICD-10-CM | POA: Diagnosis not present

## 2019-10-10 DIAGNOSIS — M65341 Trigger finger, right ring finger: Secondary | ICD-10-CM | POA: Diagnosis not present

## 2019-11-05 DIAGNOSIS — F331 Major depressive disorder, recurrent, moderate: Secondary | ICD-10-CM | POA: Diagnosis not present

## 2019-11-05 DIAGNOSIS — R3915 Urgency of urination: Secondary | ICD-10-CM | POA: Diagnosis not present

## 2019-11-05 DIAGNOSIS — F419 Anxiety disorder, unspecified: Secondary | ICD-10-CM | POA: Diagnosis not present

## 2019-11-05 DIAGNOSIS — R251 Tremor, unspecified: Secondary | ICD-10-CM | POA: Diagnosis not present

## 2019-11-05 DIAGNOSIS — I10 Essential (primary) hypertension: Secondary | ICD-10-CM | POA: Diagnosis not present

## 2019-11-05 DIAGNOSIS — F339 Major depressive disorder, recurrent, unspecified: Secondary | ICD-10-CM | POA: Diagnosis not present

## 2019-11-05 DIAGNOSIS — G8929 Other chronic pain: Secondary | ICD-10-CM | POA: Diagnosis not present

## 2019-11-05 DIAGNOSIS — G629 Polyneuropathy, unspecified: Secondary | ICD-10-CM | POA: Diagnosis not present

## 2019-11-07 DIAGNOSIS — M720 Palmar fascial fibromatosis [Dupuytren]: Secondary | ICD-10-CM | POA: Diagnosis not present

## 2019-11-14 DIAGNOSIS — Z0100 Encounter for examination of eyes and vision without abnormal findings: Secondary | ICD-10-CM | POA: Diagnosis not present

## 2019-12-05 DIAGNOSIS — F419 Anxiety disorder, unspecified: Secondary | ICD-10-CM | POA: Diagnosis not present

## 2019-12-05 DIAGNOSIS — G629 Polyneuropathy, unspecified: Secondary | ICD-10-CM | POA: Diagnosis not present

## 2019-12-05 DIAGNOSIS — G8929 Other chronic pain: Secondary | ICD-10-CM | POA: Diagnosis not present

## 2019-12-05 DIAGNOSIS — I10 Essential (primary) hypertension: Secondary | ICD-10-CM | POA: Diagnosis not present

## 2019-12-05 DIAGNOSIS — F339 Major depressive disorder, recurrent, unspecified: Secondary | ICD-10-CM | POA: Diagnosis not present

## 2019-12-05 DIAGNOSIS — F331 Major depressive disorder, recurrent, moderate: Secondary | ICD-10-CM | POA: Diagnosis not present

## 2019-12-05 DIAGNOSIS — R251 Tremor, unspecified: Secondary | ICD-10-CM | POA: Diagnosis not present

## 2019-12-05 DIAGNOSIS — R3915 Urgency of urination: Secondary | ICD-10-CM | POA: Diagnosis not present

## 2019-12-23 DIAGNOSIS — H5203 Hypermetropia, bilateral: Secondary | ICD-10-CM | POA: Diagnosis not present

## 2019-12-23 DIAGNOSIS — H2513 Age-related nuclear cataract, bilateral: Secondary | ICD-10-CM | POA: Diagnosis not present

## 2019-12-23 DIAGNOSIS — H52203 Unspecified astigmatism, bilateral: Secondary | ICD-10-CM | POA: Diagnosis not present

## 2020-01-13 DIAGNOSIS — L820 Inflamed seborrheic keratosis: Secondary | ICD-10-CM | POA: Diagnosis not present

## 2020-01-13 DIAGNOSIS — L578 Other skin changes due to chronic exposure to nonionizing radiation: Secondary | ICD-10-CM | POA: Diagnosis not present

## 2020-01-13 DIAGNOSIS — L821 Other seborrheic keratosis: Secondary | ICD-10-CM | POA: Diagnosis not present

## 2020-01-28 DIAGNOSIS — E559 Vitamin D deficiency, unspecified: Secondary | ICD-10-CM | POA: Diagnosis not present

## 2020-01-28 DIAGNOSIS — G629 Polyneuropathy, unspecified: Secondary | ICD-10-CM | POA: Diagnosis not present

## 2020-01-28 DIAGNOSIS — F419 Anxiety disorder, unspecified: Secondary | ICD-10-CM | POA: Diagnosis not present

## 2020-01-28 DIAGNOSIS — Z0000 Encounter for general adult medical examination without abnormal findings: Secondary | ICD-10-CM | POA: Diagnosis not present

## 2020-01-28 DIAGNOSIS — G8929 Other chronic pain: Secondary | ICD-10-CM | POA: Diagnosis not present

## 2020-01-28 DIAGNOSIS — F339 Major depressive disorder, recurrent, unspecified: Secondary | ICD-10-CM | POA: Diagnosis not present

## 2020-01-28 DIAGNOSIS — R251 Tremor, unspecified: Secondary | ICD-10-CM | POA: Diagnosis not present

## 2020-01-28 DIAGNOSIS — R3915 Urgency of urination: Secondary | ICD-10-CM | POA: Diagnosis not present

## 2020-01-28 DIAGNOSIS — Z0001 Encounter for general adult medical examination with abnormal findings: Secondary | ICD-10-CM | POA: Diagnosis not present

## 2020-01-28 DIAGNOSIS — I10 Essential (primary) hypertension: Secondary | ICD-10-CM | POA: Diagnosis not present

## 2020-01-30 ENCOUNTER — Emergency Department (HOSPITAL_COMMUNITY)
Admission: EM | Admit: 2020-01-30 | Discharge: 2020-01-30 | Disposition: A | Discharge status: Home / Self Care | Payer: Medicare HMO | Attending: Emergency Medicine | Admitting: Emergency Medicine

## 2020-01-30 ENCOUNTER — Other Ambulatory Visit: Payer: Self-pay

## 2020-01-30 ENCOUNTER — Emergency Department (HOSPITAL_COMMUNITY): Payer: Medicare HMO

## 2020-01-30 ENCOUNTER — Encounter (HOSPITAL_COMMUNITY): Payer: Self-pay | Admitting: *Deleted

## 2020-01-30 DIAGNOSIS — M25551 Pain in right hip: Secondary | ICD-10-CM | POA: Diagnosis not present

## 2020-01-30 DIAGNOSIS — Z5321 Procedure and treatment not carried out due to patient leaving prior to being seen by health care provider: Secondary | ICD-10-CM | POA: Diagnosis not present

## 2020-01-30 NOTE — ED Triage Notes (Signed)
Pt with right hip since last night, went to see his girlfriend and was not able to get up and out of his car today. Denies any injury to hip.

## 2020-01-30 NOTE — ED Notes (Signed)
Explained to pt that it's hours wait time and pt not willing to wait that long. Pt states he is leaving.

## 2020-01-31 DIAGNOSIS — M25551 Pain in right hip: Secondary | ICD-10-CM | POA: Diagnosis not present

## 2020-02-13 DIAGNOSIS — K5903 Drug induced constipation: Secondary | ICD-10-CM | POA: Diagnosis not present

## 2020-02-13 DIAGNOSIS — G629 Polyneuropathy, unspecified: Secondary | ICD-10-CM | POA: Diagnosis not present

## 2020-02-13 DIAGNOSIS — G250 Essential tremor: Secondary | ICD-10-CM | POA: Diagnosis not present

## 2020-02-13 DIAGNOSIS — M5030 Other cervical disc degeneration, unspecified cervical region: Secondary | ICD-10-CM | POA: Diagnosis not present

## 2020-02-13 DIAGNOSIS — N400 Enlarged prostate without lower urinary tract symptoms: Secondary | ICD-10-CM | POA: Diagnosis not present

## 2020-02-13 DIAGNOSIS — Z0001 Encounter for general adult medical examination with abnormal findings: Secondary | ICD-10-CM | POA: Diagnosis not present

## 2020-02-13 DIAGNOSIS — F331 Major depressive disorder, recurrent, moderate: Secondary | ICD-10-CM | POA: Diagnosis not present

## 2020-02-13 DIAGNOSIS — G4700 Insomnia, unspecified: Secondary | ICD-10-CM | POA: Diagnosis not present

## 2020-02-13 DIAGNOSIS — F411 Generalized anxiety disorder: Secondary | ICD-10-CM | POA: Diagnosis not present

## 2020-02-13 DIAGNOSIS — I10 Essential (primary) hypertension: Secondary | ICD-10-CM | POA: Diagnosis not present

## 2020-02-25 DIAGNOSIS — R972 Elevated prostate specific antigen [PSA]: Secondary | ICD-10-CM | POA: Diagnosis not present

## 2020-02-25 DIAGNOSIS — N401 Enlarged prostate with lower urinary tract symptoms: Secondary | ICD-10-CM | POA: Diagnosis not present

## 2020-02-25 DIAGNOSIS — R350 Frequency of micturition: Secondary | ICD-10-CM | POA: Diagnosis not present

## 2020-03-05 DIAGNOSIS — R251 Tremor, unspecified: Secondary | ICD-10-CM | POA: Diagnosis not present

## 2020-03-05 DIAGNOSIS — G8929 Other chronic pain: Secondary | ICD-10-CM | POA: Diagnosis not present

## 2020-03-05 DIAGNOSIS — I10 Essential (primary) hypertension: Secondary | ICD-10-CM | POA: Diagnosis not present

## 2020-03-05 DIAGNOSIS — F331 Major depressive disorder, recurrent, moderate: Secondary | ICD-10-CM | POA: Diagnosis not present

## 2020-03-05 DIAGNOSIS — R3915 Urgency of urination: Secondary | ICD-10-CM | POA: Diagnosis not present

## 2020-03-05 DIAGNOSIS — F419 Anxiety disorder, unspecified: Secondary | ICD-10-CM | POA: Diagnosis not present

## 2020-03-05 DIAGNOSIS — F339 Major depressive disorder, recurrent, unspecified: Secondary | ICD-10-CM | POA: Diagnosis not present

## 2020-03-05 DIAGNOSIS — G629 Polyneuropathy, unspecified: Secondary | ICD-10-CM | POA: Diagnosis not present

## 2020-03-06 DIAGNOSIS — F419 Anxiety disorder, unspecified: Secondary | ICD-10-CM | POA: Diagnosis not present

## 2020-03-06 DIAGNOSIS — R251 Tremor, unspecified: Secondary | ICD-10-CM | POA: Diagnosis not present

## 2020-03-06 DIAGNOSIS — F331 Major depressive disorder, recurrent, moderate: Secondary | ICD-10-CM | POA: Diagnosis not present

## 2020-03-06 DIAGNOSIS — I10 Essential (primary) hypertension: Secondary | ICD-10-CM | POA: Diagnosis not present

## 2020-03-06 DIAGNOSIS — F339 Major depressive disorder, recurrent, unspecified: Secondary | ICD-10-CM | POA: Diagnosis not present

## 2020-03-06 DIAGNOSIS — N400 Enlarged prostate without lower urinary tract symptoms: Secondary | ICD-10-CM | POA: Diagnosis not present

## 2020-03-06 DIAGNOSIS — R3915 Urgency of urination: Secondary | ICD-10-CM | POA: Diagnosis not present

## 2020-03-06 DIAGNOSIS — G8929 Other chronic pain: Secondary | ICD-10-CM | POA: Diagnosis not present

## 2020-03-06 DIAGNOSIS — G629 Polyneuropathy, unspecified: Secondary | ICD-10-CM | POA: Diagnosis not present

## 2020-03-09 ENCOUNTER — Ambulatory Visit: Payer: Medicare HMO | Admitting: Gastroenterology

## 2020-04-06 DIAGNOSIS — F331 Major depressive disorder, recurrent, moderate: Secondary | ICD-10-CM | POA: Diagnosis not present

## 2020-04-06 DIAGNOSIS — F339 Major depressive disorder, recurrent, unspecified: Secondary | ICD-10-CM | POA: Diagnosis not present

## 2020-04-06 DIAGNOSIS — I10 Essential (primary) hypertension: Secondary | ICD-10-CM | POA: Diagnosis not present

## 2020-04-06 DIAGNOSIS — G8929 Other chronic pain: Secondary | ICD-10-CM | POA: Diagnosis not present

## 2020-04-06 DIAGNOSIS — R251 Tremor, unspecified: Secondary | ICD-10-CM | POA: Diagnosis not present

## 2020-04-06 DIAGNOSIS — R3915 Urgency of urination: Secondary | ICD-10-CM | POA: Diagnosis not present

## 2020-04-06 DIAGNOSIS — G629 Polyneuropathy, unspecified: Secondary | ICD-10-CM | POA: Diagnosis not present

## 2020-04-06 DIAGNOSIS — F419 Anxiety disorder, unspecified: Secondary | ICD-10-CM | POA: Diagnosis not present

## 2020-04-16 DIAGNOSIS — N401 Enlarged prostate with lower urinary tract symptoms: Secondary | ICD-10-CM | POA: Diagnosis not present

## 2020-04-16 DIAGNOSIS — R972 Elevated prostate specific antigen [PSA]: Secondary | ICD-10-CM | POA: Diagnosis not present

## 2020-04-23 DIAGNOSIS — R972 Elevated prostate specific antigen [PSA]: Secondary | ICD-10-CM | POA: Diagnosis not present

## 2020-04-23 DIAGNOSIS — N401 Enlarged prostate with lower urinary tract symptoms: Secondary | ICD-10-CM | POA: Diagnosis not present

## 2020-04-23 DIAGNOSIS — R3912 Poor urinary stream: Secondary | ICD-10-CM | POA: Diagnosis not present

## 2020-04-23 DIAGNOSIS — N5201 Erectile dysfunction due to arterial insufficiency: Secondary | ICD-10-CM | POA: Diagnosis not present

## 2020-04-23 DIAGNOSIS — R350 Frequency of micturition: Secondary | ICD-10-CM | POA: Diagnosis not present

## 2020-05-14 DIAGNOSIS — M5030 Other cervical disc degeneration, unspecified cervical region: Secondary | ICD-10-CM | POA: Diagnosis not present

## 2020-05-14 DIAGNOSIS — G4700 Insomnia, unspecified: Secondary | ICD-10-CM | POA: Diagnosis not present

## 2020-05-14 DIAGNOSIS — N400 Enlarged prostate without lower urinary tract symptoms: Secondary | ICD-10-CM | POA: Diagnosis not present

## 2020-05-14 DIAGNOSIS — F411 Generalized anxiety disorder: Secondary | ICD-10-CM | POA: Diagnosis not present

## 2020-05-14 DIAGNOSIS — I10 Essential (primary) hypertension: Secondary | ICD-10-CM | POA: Diagnosis not present

## 2020-05-14 DIAGNOSIS — K5903 Drug induced constipation: Secondary | ICD-10-CM | POA: Diagnosis not present

## 2020-05-14 DIAGNOSIS — F331 Major depressive disorder, recurrent, moderate: Secondary | ICD-10-CM | POA: Diagnosis not present

## 2020-05-14 DIAGNOSIS — G250 Essential tremor: Secondary | ICD-10-CM | POA: Diagnosis not present

## 2020-05-14 DIAGNOSIS — G629 Polyneuropathy, unspecified: Secondary | ICD-10-CM | POA: Diagnosis not present

## 2020-06-05 DIAGNOSIS — N400 Enlarged prostate without lower urinary tract symptoms: Secondary | ICD-10-CM | POA: Diagnosis not present

## 2020-06-05 DIAGNOSIS — F411 Generalized anxiety disorder: Secondary | ICD-10-CM | POA: Diagnosis not present

## 2020-06-05 DIAGNOSIS — G4700 Insomnia, unspecified: Secondary | ICD-10-CM | POA: Diagnosis not present

## 2020-06-05 DIAGNOSIS — G250 Essential tremor: Secondary | ICD-10-CM | POA: Diagnosis not present

## 2020-06-05 DIAGNOSIS — I10 Essential (primary) hypertension: Secondary | ICD-10-CM | POA: Diagnosis not present

## 2020-06-05 DIAGNOSIS — K5903 Drug induced constipation: Secondary | ICD-10-CM | POA: Diagnosis not present

## 2020-06-05 DIAGNOSIS — G629 Polyneuropathy, unspecified: Secondary | ICD-10-CM | POA: Diagnosis not present

## 2020-06-05 DIAGNOSIS — M5030 Other cervical disc degeneration, unspecified cervical region: Secondary | ICD-10-CM | POA: Diagnosis not present

## 2020-06-05 DIAGNOSIS — F331 Major depressive disorder, recurrent, moderate: Secondary | ICD-10-CM | POA: Diagnosis not present

## 2020-06-08 ENCOUNTER — Ambulatory Visit: Payer: Medicare HMO | Admitting: Orthopedic Surgery

## 2020-06-15 ENCOUNTER — Ambulatory Visit: Payer: Medicare HMO

## 2020-06-15 ENCOUNTER — Other Ambulatory Visit: Payer: Self-pay

## 2020-06-15 ENCOUNTER — Ambulatory Visit (INDEPENDENT_AMBULATORY_CARE_PROVIDER_SITE_OTHER): Payer: Medicare Other | Admitting: Orthopedic Surgery

## 2020-06-15 VITALS — Ht 70.0 in | Wt 186.0 lb

## 2020-06-15 DIAGNOSIS — M25511 Pain in right shoulder: Secondary | ICD-10-CM

## 2020-06-15 DIAGNOSIS — G8929 Other chronic pain: Secondary | ICD-10-CM

## 2020-06-15 DIAGNOSIS — M75101 Unspecified rotator cuff tear or rupture of right shoulder, not specified as traumatic: Secondary | ICD-10-CM | POA: Diagnosis not present

## 2020-06-15 DIAGNOSIS — M75102 Unspecified rotator cuff tear or rupture of left shoulder, not specified as traumatic: Secondary | ICD-10-CM

## 2020-06-15 NOTE — Progress Notes (Signed)
[name]  06/15/2020  Body mass index is 26.69 kg/m.  ASSESSMENT AND PLAN:     [name] he had rotator cuff surgery 23 years ago.  He said the surgeon told him his cuff was thin at that time  He complains of scapular pain and anterior shoulder pain.  He says it looks like the right arm is more prominent than the left including the scapula and when he rotates his elbow he feels a popping in the shoulder.  His x-rays show proximal migration of the humerus without arthritis but he says he can live with it now and I agree.  He should live with this until he becomes more symptomatic   Encounter Diagnoses  Name Primary?  . Chronic right shoulder pain Yes  . Tear of left rotator cuff, unspecified tear extent, unspecified whether traumatic       HISTORY SECTION :  Chief Complaint  Patient presents with  . Shoulder Pain    Right shoulder pain   Ah 70 years old he had a rotator cuff surgery back in 1998 he was told he had a thin cuff at that time.  He tolerated that well and was doing well until about 3 months ago when he started having some anterior shoulder pain and clicking and popping when he supinated his wrist.  He says the pain is not unbearable but he would like this checked out  He still has good motion in his shoulder and good strength but he notices that the right shoulder is more prominent than the left and the scapula looks like it may be sticking out.    Review of Systems  Musculoskeletal: Negative for neck pain.  Neurological: Negative for tingling.     has a past medical history of Anxiety, Arthritis, BPH (benign prostatic hyperplasia), Cancer ([place]), Chronic neck pain, Constipation, Difficulty sleeping, GERD (gastroesophageal reflux disease), H/O jaundice, Hypertension, Nasal congestion, Palpitations, and PONV (postoperative nausea and vomiting).   Past Surgical History:  Procedure Laterality Date  . ANTERIOR CERVICAL DECOMP/DISCECTOMY FUSION N/A  05/24/2018   Procedure: ANTERIOR CERVICAL DECOMPRESSION/DISCECTOMY FUSION, INTERBODY PROSTHESIS, PLATE/SCREWS CERVICAL THREE- CERVICAL FOUR; EXPLORE FUSION;  Surgeon: Newman Pies, MD;  Location: [facility];  Service: Neurosurgery;  Laterality: N/A;  . BIOPSY  12/31/2018   Procedure: BIOPSY;  Surgeon: Danie Binder, MD;  [facility];  Service: Endoscopy;;  . [place], 2010   C6-C7, C5-C6, C4-C5  . COLONOSCOPY WITH PROPOFOL N/A 07/24/2012   Dr. Oneida Alar: 5 colon polyps removed, one simple adenoma the other is hyperplastic, mild diverticulosis, small internal hemorrhoids.  Next colonoscopy 10 years  . ESOPHAGOGASTRODUODENOSCOPY (EGD) WITH PROPOFOL N/A 12/31/2018   Dr. Oneida Alar: Benign-appearing esophageal stricture, nonbleeding duodenal diverticulum, mild gastritis/duodenitis.  Esophagus dilated.  Gastric biopsy benign, no H. pylori.  Marland Kitchen HERNIA REPAIR    . INGUINAL HERNIA REPAIR  11/08/2011   Procedure: HERNIA REPAIR INGUINAL ADULT;  Surgeon: Rolm Bookbinder, MD;  [facility];  Service: General;  Laterality: Right;  . KNEE SURGERY  2009   right - scope  . SAVORY DILATION N/A 12/31/2018   Procedure: SAVORY DILATION;  Surgeon: Danie Binder, MD;  [facility];  Service: Endoscopy;  Laterality: N/A;  . [place]   right - scope  . TONSILLECTOMY      Social History   Tobacco Use  . Smoking status: Former Smoker    Quit date: 10/16/1993    Years since quitting:  26.6  . Smokeless tobacco: Never Used  Vaping Use  . Vaping Use: Never used  Substance Use Topics  . Alcohol use: No  . Drug use: No    Family History  Problem Relation Age of Onset  . Colon polyps Father        age>60  . Colon cancer Neg Hx       Allergies  Allergen Reactions  . Penicillins Hives    All over the body. Has patient had a PCN reaction causing immediate rash, facial/tongue/throat swelling, SOB or lightheadedness with hypotension: No Has patient had a  PCN reaction causing severe rash involving mucus membranes or skin necrosis: No Has patient had a PCN reaction that required hospitalization: No Has patient had a PCN reaction occurring within the last 10 years: No If all of the above answers are "NO", then may proceed with Cephalosporin use.      Current Outpatient Medications:  .  alfuzosin (UROXATRAL) 10 MG 24 hr tablet, Take 10 mg by mouth at bedtime. , Disp: , Rfl:  .  ALPRAZolam (XANAX) 0.5 MG tablet, Take 0.5 mg by mouth 2 (two) times daily as needed for anxiety. , Disp: , Rfl:  .  lactulose (CHRONULAC) 10 GM/15ML solution, Take 10 g by mouth 2 (two) times daily as needed for moderate constipation. , Disp: , Rfl:  .  losartan (COZAAR) 25 MG tablet, Take 25 mg by mouth daily., Disp: , Rfl:  .  Melatonin 10 MG TABS, Take 20 mg by mouth at bedtime as needed (insomnia). , Disp: , Rfl:  .  Multiple Vitamin (MULTIVITAMIN WITH MINERALS) TABS, Take 1 tablet by mouth daily., Disp: , Rfl:  .  oxyCODONE-acetaminophen (PERCOCET) 10-325 MG tablet, Take 1 tablet by mouth every 4 (four) hours as needed for pain. (Patient taking differently: Take 1 tablet by mouth every 6 (six) hours as needed for pain.), Disp: 50 tablet, Rfl: 0 .  pregabalin (LYRICA) 100 MG capsule, Take 100 mg by mouth 2 (two) times daily. , Disp: , Rfl: 1 .  propranolol (INDERAL) 20 MG tablet, Take 20 mg by mouth 2 (two) times a day., Disp: , Rfl:  .  zolpidem (AMBIEN CR) 12.5 MG CR tablet, Take 12.5 mg by mouth at bedtime., Disp: , Rfl:  .  LINZESS 290 MCG CAPS capsule, Take 290 mg by mouth every other day.  (Patient not taking: Reported on 06/15/2020), Disp: , Rfl: 2   PHYSICAL EXAM SECTION: Ht 5\' 10"  (1.778 m)   Wt 186 lb (84.4 kg)   BMI 26.69 kg/m   Body mass index is 26.69 kg/m.   General appearance: Well-developed well-nourished no gross deformities  Eyes clear no evidence of conjunctivitis or jaundice, extraocular muscles intact   Lymph nodes: No lymphadenopathy in  the left shoulder region  Neck is supple without palpable mass, full range of motion  Cardiovascular normal pulse and perfusion in all 4 extremities normal color without edema  Neurologically  normal, no sensation loss or deficits no pathologic reflexes  Psychological: Awake alert and oriented x3 mood and affect normal  Skin no lacerations or ulcerations no nodularity no palpable masses, no erythema or nodularity  Musculoskeletal:   Left shoulder looks normal.  Right shoulder shows prominence anteriorly winging of the scapula and atrophy in the suprascapular region  He has excellent range of motion and good strength he has some tenderness in the rotator interval     4:16 PM

## 2020-07-08 DIAGNOSIS — R0982 Postnasal drip: Secondary | ICD-10-CM | POA: Diagnosis not present

## 2020-07-08 DIAGNOSIS — R059 Cough, unspecified: Secondary | ICD-10-CM | POA: Diagnosis not present

## 2020-07-08 DIAGNOSIS — J029 Acute pharyngitis, unspecified: Secondary | ICD-10-CM | POA: Diagnosis not present

## 2020-07-08 DIAGNOSIS — J069 Acute upper respiratory infection, unspecified: Secondary | ICD-10-CM | POA: Diagnosis not present

## 2020-08-03 DIAGNOSIS — F331 Major depressive disorder, recurrent, moderate: Secondary | ICD-10-CM | POA: Diagnosis not present

## 2020-08-03 DIAGNOSIS — M5030 Other cervical disc degeneration, unspecified cervical region: Secondary | ICD-10-CM | POA: Diagnosis not present

## 2020-08-03 DIAGNOSIS — I10 Essential (primary) hypertension: Secondary | ICD-10-CM | POA: Diagnosis not present

## 2020-08-03 DIAGNOSIS — M542 Cervicalgia: Secondary | ICD-10-CM | POA: Diagnosis not present

## 2020-08-08 IMAGING — CR DG CERVICAL SPINE 1V
1 series · 1 of 1 positions shown · non-contrast
Comparison: Radiographs August 28, 2009.

CLINICAL DATA: Status post C3-4 fusion.

EXAM:
DG CERVICAL SPINE - 1 VIEW

[xtable lateral]
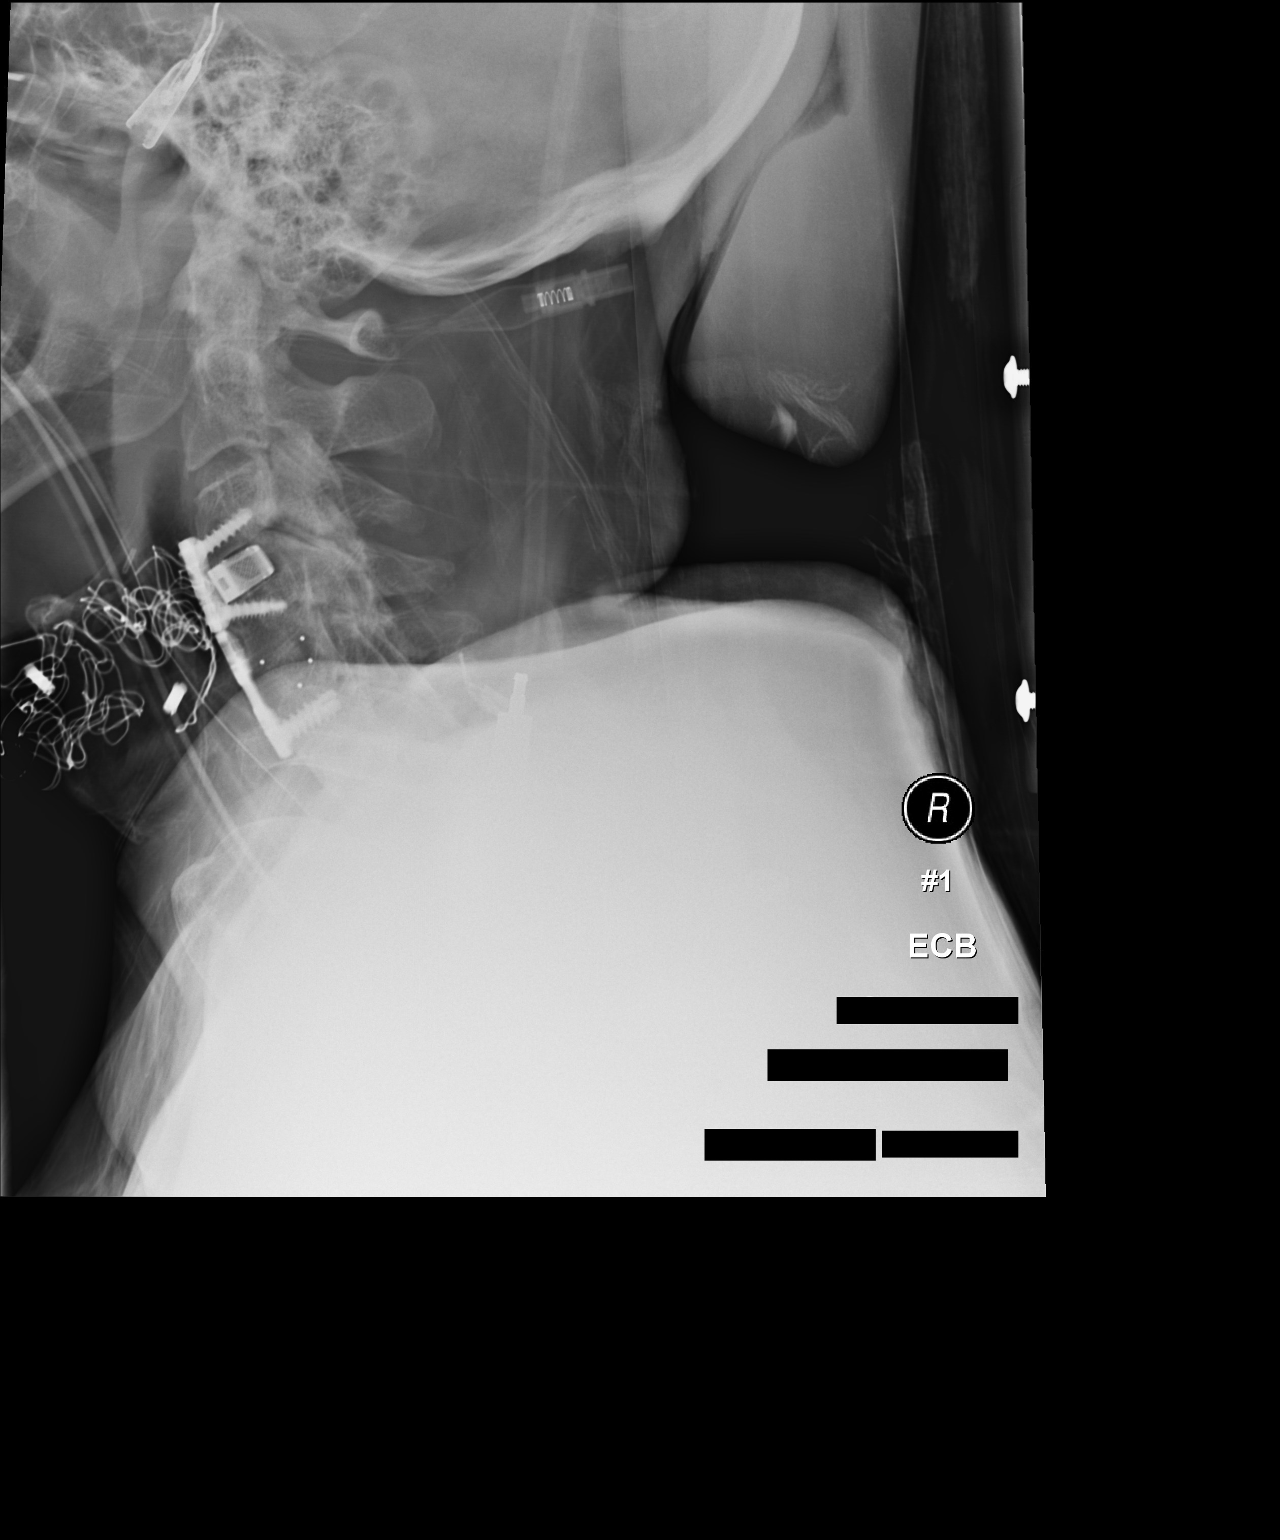

[1 of 1 positions shown; findings below may reference images not displayed]

FINDINGS: Single intraoperative cross-table lateral projection of the cervical
spine demonstrates the patient be status post surgical anterior
fusion of C3-4 with interbody spacer. Previous anterior fusion of
C4-5 is again noted. Good alignment of vertebral bodies is noted.
IMPRESSION: Interval anterior fusion of C3-4 with good alignment of vertebral
bodies.

## 2020-09-02 DIAGNOSIS — G629 Polyneuropathy, unspecified: Secondary | ICD-10-CM | POA: Diagnosis not present

## 2020-09-02 DIAGNOSIS — G8929 Other chronic pain: Secondary | ICD-10-CM | POA: Diagnosis not present

## 2020-09-02 DIAGNOSIS — I10 Essential (primary) hypertension: Secondary | ICD-10-CM | POA: Diagnosis not present

## 2020-09-02 DIAGNOSIS — F339 Major depressive disorder, recurrent, unspecified: Secondary | ICD-10-CM | POA: Diagnosis not present

## 2020-09-17 DIAGNOSIS — N400 Enlarged prostate without lower urinary tract symptoms: Secondary | ICD-10-CM | POA: Diagnosis not present

## 2020-09-17 DIAGNOSIS — Z0001 Encounter for general adult medical examination with abnormal findings: Secondary | ICD-10-CM | POA: Diagnosis not present

## 2020-09-17 DIAGNOSIS — R7301 Impaired fasting glucose: Secondary | ICD-10-CM | POA: Diagnosis not present

## 2020-09-17 DIAGNOSIS — I10 Essential (primary) hypertension: Secondary | ICD-10-CM | POA: Diagnosis not present

## 2020-09-25 DIAGNOSIS — F411 Generalized anxiety disorder: Secondary | ICD-10-CM | POA: Diagnosis not present

## 2020-09-25 DIAGNOSIS — I10 Essential (primary) hypertension: Secondary | ICD-10-CM | POA: Diagnosis not present

## 2020-09-25 DIAGNOSIS — G629 Polyneuropathy, unspecified: Secondary | ICD-10-CM | POA: Diagnosis not present

## 2020-09-25 DIAGNOSIS — F331 Major depressive disorder, recurrent, moderate: Secondary | ICD-10-CM | POA: Diagnosis not present

## 2020-10-04 DIAGNOSIS — I10 Essential (primary) hypertension: Secondary | ICD-10-CM | POA: Diagnosis not present

## 2020-10-04 DIAGNOSIS — F419 Anxiety disorder, unspecified: Secondary | ICD-10-CM | POA: Diagnosis not present

## 2020-10-15 DIAGNOSIS — R972 Elevated prostate specific antigen [PSA]: Secondary | ICD-10-CM | POA: Diagnosis not present

## 2020-10-22 DIAGNOSIS — N401 Enlarged prostate with lower urinary tract symptoms: Secondary | ICD-10-CM | POA: Diagnosis not present

## 2020-10-22 DIAGNOSIS — R972 Elevated prostate specific antigen [PSA]: Secondary | ICD-10-CM | POA: Diagnosis not present

## 2020-10-22 DIAGNOSIS — R3912 Poor urinary stream: Secondary | ICD-10-CM | POA: Diagnosis not present

## 2020-10-22 DIAGNOSIS — R350 Frequency of micturition: Secondary | ICD-10-CM | POA: Diagnosis not present

## 2020-12-03 DIAGNOSIS — F419 Anxiety disorder, unspecified: Secondary | ICD-10-CM | POA: Diagnosis not present

## 2020-12-03 DIAGNOSIS — I10 Essential (primary) hypertension: Secondary | ICD-10-CM | POA: Diagnosis not present

## 2020-12-14 DIAGNOSIS — X32XXXD Exposure to sunlight, subsequent encounter: Secondary | ICD-10-CM | POA: Diagnosis not present

## 2020-12-14 DIAGNOSIS — L570 Actinic keratosis: Secondary | ICD-10-CM | POA: Diagnosis not present

## 2020-12-14 DIAGNOSIS — D0359 Melanoma in situ of other part of trunk: Secondary | ICD-10-CM | POA: Diagnosis not present

## 2020-12-31 DIAGNOSIS — D0359 Melanoma in situ of other part of trunk: Secondary | ICD-10-CM | POA: Diagnosis not present

## 2021-01-03 DIAGNOSIS — I10 Essential (primary) hypertension: Secondary | ICD-10-CM | POA: Diagnosis not present

## 2021-01-03 DIAGNOSIS — F419 Anxiety disorder, unspecified: Secondary | ICD-10-CM | POA: Diagnosis not present

## 2021-02-03 DIAGNOSIS — I10 Essential (primary) hypertension: Secondary | ICD-10-CM | POA: Diagnosis not present

## 2021-02-03 DIAGNOSIS — F419 Anxiety disorder, unspecified: Secondary | ICD-10-CM | POA: Diagnosis not present

## 2021-02-04 DIAGNOSIS — Z08 Encounter for follow-up examination after completed treatment for malignant neoplasm: Secondary | ICD-10-CM | POA: Diagnosis not present

## 2021-02-04 DIAGNOSIS — D225 Melanocytic nevi of trunk: Secondary | ICD-10-CM | POA: Diagnosis not present

## 2021-02-04 DIAGNOSIS — Z85820 Personal history of malignant melanoma of skin: Secondary | ICD-10-CM | POA: Diagnosis not present

## 2021-03-05 DIAGNOSIS — I10 Essential (primary) hypertension: Secondary | ICD-10-CM | POA: Diagnosis not present

## 2021-03-05 DIAGNOSIS — E1165 Type 2 diabetes mellitus with hyperglycemia: Secondary | ICD-10-CM | POA: Diagnosis not present

## 2021-03-17 DIAGNOSIS — Z23 Encounter for immunization: Secondary | ICD-10-CM | POA: Diagnosis not present

## 2021-03-17 DIAGNOSIS — F411 Generalized anxiety disorder: Secondary | ICD-10-CM | POA: Diagnosis not present

## 2021-03-17 DIAGNOSIS — K649 Unspecified hemorrhoids: Secondary | ICD-10-CM | POA: Diagnosis not present

## 2021-03-17 DIAGNOSIS — F410 Panic disorder [episodic paroxysmal anxiety] without agoraphobia: Secondary | ICD-10-CM | POA: Diagnosis not present

## 2021-04-05 DIAGNOSIS — I10 Essential (primary) hypertension: Secondary | ICD-10-CM | POA: Diagnosis not present

## 2021-04-05 DIAGNOSIS — F419 Anxiety disorder, unspecified: Secondary | ICD-10-CM | POA: Diagnosis not present

## 2021-04-20 DIAGNOSIS — Z0001 Encounter for general adult medical examination with abnormal findings: Secondary | ICD-10-CM | POA: Diagnosis not present

## 2021-05-05 DIAGNOSIS — I10 Essential (primary) hypertension: Secondary | ICD-10-CM | POA: Diagnosis not present

## 2021-05-05 DIAGNOSIS — F419 Anxiety disorder, unspecified: Secondary | ICD-10-CM | POA: Diagnosis not present

## 2021-06-03 DIAGNOSIS — R972 Elevated prostate specific antigen [PSA]: Secondary | ICD-10-CM | POA: Diagnosis not present

## 2021-06-08 DIAGNOSIS — R972 Elevated prostate specific antigen [PSA]: Secondary | ICD-10-CM | POA: Diagnosis not present

## 2021-06-08 DIAGNOSIS — R3912 Poor urinary stream: Secondary | ICD-10-CM | POA: Diagnosis not present

## 2021-06-08 DIAGNOSIS — R350 Frequency of micturition: Secondary | ICD-10-CM | POA: Diagnosis not present

## 2021-06-08 DIAGNOSIS — N401 Enlarged prostate with lower urinary tract symptoms: Secondary | ICD-10-CM | POA: Diagnosis not present

## 2021-06-17 DIAGNOSIS — J069 Acute upper respiratory infection, unspecified: Secondary | ICD-10-CM | POA: Diagnosis not present

## 2021-06-25 DIAGNOSIS — F5109 Other insomnia not due to a substance or known physiological condition: Secondary | ICD-10-CM | POA: Diagnosis not present

## 2021-06-25 DIAGNOSIS — N401 Enlarged prostate with lower urinary tract symptoms: Secondary | ICD-10-CM | POA: Diagnosis not present

## 2021-06-25 DIAGNOSIS — F329 Major depressive disorder, single episode, unspecified: Secondary | ICD-10-CM | POA: Diagnosis not present

## 2021-06-25 DIAGNOSIS — I10 Essential (primary) hypertension: Secondary | ICD-10-CM | POA: Diagnosis not present

## 2021-07-01 DIAGNOSIS — L570 Actinic keratosis: Secondary | ICD-10-CM | POA: Diagnosis not present

## 2021-07-01 DIAGNOSIS — D225 Melanocytic nevi of trunk: Secondary | ICD-10-CM | POA: Diagnosis not present

## 2021-07-01 DIAGNOSIS — Z08 Encounter for follow-up examination after completed treatment for malignant neoplasm: Secondary | ICD-10-CM | POA: Diagnosis not present

## 2021-07-01 DIAGNOSIS — Z85820 Personal history of malignant melanoma of skin: Secondary | ICD-10-CM | POA: Diagnosis not present

## 2021-12-22 DIAGNOSIS — Z08 Encounter for follow-up examination after completed treatment for malignant neoplasm: Secondary | ICD-10-CM | POA: Diagnosis not present

## 2021-12-22 DIAGNOSIS — D225 Melanocytic nevi of trunk: Secondary | ICD-10-CM | POA: Diagnosis not present

## 2021-12-22 DIAGNOSIS — Z1283 Encounter for screening for malignant neoplasm of skin: Secondary | ICD-10-CM | POA: Diagnosis not present

## 2021-12-22 DIAGNOSIS — Z85820 Personal history of malignant melanoma of skin: Secondary | ICD-10-CM | POA: Diagnosis not present

## 2022-01-31 DIAGNOSIS — R972 Elevated prostate specific antigen [PSA]: Secondary | ICD-10-CM | POA: Diagnosis not present

## 2022-02-03 DIAGNOSIS — N401 Enlarged prostate with lower urinary tract symptoms: Secondary | ICD-10-CM | POA: Diagnosis not present

## 2022-02-03 DIAGNOSIS — N5201 Erectile dysfunction due to arterial insufficiency: Secondary | ICD-10-CM | POA: Diagnosis not present

## 2022-02-03 DIAGNOSIS — R972 Elevated prostate specific antigen [PSA]: Secondary | ICD-10-CM | POA: Diagnosis not present

## 2022-02-03 DIAGNOSIS — R350 Frequency of micturition: Secondary | ICD-10-CM | POA: Diagnosis not present

## 2022-04-11 DIAGNOSIS — Z85828 Personal history of other malignant neoplasm of skin: Secondary | ICD-10-CM | POA: Diagnosis not present

## 2022-04-11 DIAGNOSIS — J302 Other seasonal allergic rhinitis: Secondary | ICD-10-CM | POA: Diagnosis not present

## 2022-04-11 DIAGNOSIS — R972 Elevated prostate specific antigen [PSA]: Secondary | ICD-10-CM | POA: Diagnosis not present

## 2022-04-11 DIAGNOSIS — K219 Gastro-esophageal reflux disease without esophagitis: Secondary | ICD-10-CM | POA: Diagnosis not present

## 2022-05-05 DIAGNOSIS — H2512 Age-related nuclear cataract, left eye: Secondary | ICD-10-CM | POA: Diagnosis not present

## 2022-05-05 DIAGNOSIS — Z961 Presence of intraocular lens: Secondary | ICD-10-CM | POA: Diagnosis not present

## 2022-05-11 DIAGNOSIS — R7301 Impaired fasting glucose: Secondary | ICD-10-CM | POA: Diagnosis not present

## 2022-05-11 DIAGNOSIS — I10 Essential (primary) hypertension: Secondary | ICD-10-CM | POA: Diagnosis not present

## 2022-05-17 DIAGNOSIS — K649 Unspecified hemorrhoids: Secondary | ICD-10-CM | POA: Diagnosis not present

## 2022-05-17 DIAGNOSIS — F410 Panic disorder [episodic paroxysmal anxiety] without agoraphobia: Secondary | ICD-10-CM | POA: Diagnosis not present

## 2022-05-17 DIAGNOSIS — R251 Tremor, unspecified: Secondary | ICD-10-CM | POA: Diagnosis not present

## 2022-05-17 DIAGNOSIS — F411 Generalized anxiety disorder: Secondary | ICD-10-CM | POA: Diagnosis not present

## 2022-05-19 DIAGNOSIS — H01001 Unspecified blepharitis right upper eyelid: Secondary | ICD-10-CM | POA: Diagnosis not present

## 2022-05-19 DIAGNOSIS — H02831 Dermatochalasis of right upper eyelid: Secondary | ICD-10-CM | POA: Diagnosis not present

## 2022-05-19 DIAGNOSIS — H02834 Dermatochalasis of left upper eyelid: Secondary | ICD-10-CM | POA: Diagnosis not present

## 2022-05-19 DIAGNOSIS — H25812 Combined forms of age-related cataract, left eye: Secondary | ICD-10-CM | POA: Diagnosis not present

## 2022-05-25 DIAGNOSIS — Z79899 Other long term (current) drug therapy: Secondary | ICD-10-CM | POA: Diagnosis not present

## 2022-05-25 DIAGNOSIS — I10 Essential (primary) hypertension: Secondary | ICD-10-CM | POA: Diagnosis not present

## 2022-05-25 DIAGNOSIS — F411 Generalized anxiety disorder: Secondary | ICD-10-CM | POA: Diagnosis not present

## 2022-05-25 DIAGNOSIS — J029 Acute pharyngitis, unspecified: Secondary | ICD-10-CM | POA: Diagnosis not present

## 2022-06-08 ENCOUNTER — Encounter (HOSPITAL_COMMUNITY): Payer: Medicare Other

## 2022-06-10 ENCOUNTER — Ambulatory Visit: Admit: 2022-06-10 | Payer: Medicare Other | Admitting: Ophthalmology

## 2022-06-10 SURGERY — PHACOEMULSIFICATION, CATARACT, WITH IOL INSERTION
Anesthesia: Monitor Anesthesia Care | Laterality: Left

## 2022-06-27 ENCOUNTER — Encounter: Payer: Self-pay | Admitting: *Deleted

## 2022-07-05 DIAGNOSIS — F5104 Psychophysiologic insomnia: Secondary | ICD-10-CM | POA: Diagnosis not present

## 2022-07-05 DIAGNOSIS — F411 Generalized anxiety disorder: Secondary | ICD-10-CM | POA: Diagnosis not present

## 2022-07-05 DIAGNOSIS — R251 Tremor, unspecified: Secondary | ICD-10-CM | POA: Diagnosis not present

## 2022-07-05 DIAGNOSIS — I10 Essential (primary) hypertension: Secondary | ICD-10-CM | POA: Diagnosis not present

## 2022-07-25 DIAGNOSIS — R972 Elevated prostate specific antigen [PSA]: Secondary | ICD-10-CM | POA: Diagnosis not present

## 2022-07-27 DIAGNOSIS — Z961 Presence of intraocular lens: Secondary | ICD-10-CM | POA: Diagnosis not present

## 2022-07-27 DIAGNOSIS — H04123 Dry eye syndrome of bilateral lacrimal glands: Secondary | ICD-10-CM | POA: Diagnosis not present

## 2022-07-27 DIAGNOSIS — H25812 Combined forms of age-related cataract, left eye: Secondary | ICD-10-CM | POA: Diagnosis not present

## 2022-08-04 ENCOUNTER — Ambulatory Visit: Payer: Medicare Other | Admitting: Internal Medicine

## 2022-08-04 ENCOUNTER — Encounter: Payer: Self-pay | Admitting: Radiology

## 2022-08-04 DIAGNOSIS — N5201 Erectile dysfunction due to arterial insufficiency: Secondary | ICD-10-CM | POA: Diagnosis not present

## 2022-08-04 DIAGNOSIS — R350 Frequency of micturition: Secondary | ICD-10-CM | POA: Diagnosis not present

## 2022-08-04 DIAGNOSIS — N401 Enlarged prostate with lower urinary tract symptoms: Secondary | ICD-10-CM | POA: Diagnosis not present

## 2022-08-04 DIAGNOSIS — R972 Elevated prostate specific antigen [PSA]: Secondary | ICD-10-CM | POA: Diagnosis not present

## 2022-08-19 DIAGNOSIS — H25811 Combined forms of age-related cataract, right eye: Secondary | ICD-10-CM | POA: Diagnosis not present

## 2022-08-19 DIAGNOSIS — H25812 Combined forms of age-related cataract, left eye: Secondary | ICD-10-CM | POA: Diagnosis not present

## 2022-09-09 DIAGNOSIS — J00 Acute nasopharyngitis [common cold]: Secondary | ICD-10-CM | POA: Diagnosis not present

## 2022-09-26 DIAGNOSIS — H26492 Other secondary cataract, left eye: Secondary | ICD-10-CM | POA: Diagnosis not present

## 2022-11-23 DIAGNOSIS — I10 Essential (primary) hypertension: Secondary | ICD-10-CM | POA: Diagnosis not present

## 2022-11-23 DIAGNOSIS — R251 Tremor, unspecified: Secondary | ICD-10-CM | POA: Diagnosis not present

## 2022-11-23 DIAGNOSIS — F411 Generalized anxiety disorder: Secondary | ICD-10-CM | POA: Diagnosis not present

## 2022-11-23 DIAGNOSIS — F5104 Psychophysiologic insomnia: Secondary | ICD-10-CM | POA: Diagnosis not present

## 2022-11-23 DIAGNOSIS — F329 Major depressive disorder, single episode, unspecified: Secondary | ICD-10-CM | POA: Diagnosis not present

## 2023-02-09 DIAGNOSIS — L570 Actinic keratosis: Secondary | ICD-10-CM | POA: Diagnosis not present

## 2023-02-09 DIAGNOSIS — L820 Inflamed seborrheic keratosis: Secondary | ICD-10-CM | POA: Diagnosis not present

## 2023-02-09 DIAGNOSIS — X32XXXD Exposure to sunlight, subsequent encounter: Secondary | ICD-10-CM | POA: Diagnosis not present

## 2023-02-09 DIAGNOSIS — L218 Other seborrheic dermatitis: Secondary | ICD-10-CM | POA: Diagnosis not present

## 2023-03-30 DIAGNOSIS — R972 Elevated prostate specific antigen [PSA]: Secondary | ICD-10-CM | POA: Diagnosis not present

## 2023-04-10 ENCOUNTER — Telehealth: Payer: Self-pay | Admitting: *Deleted

## 2023-04-10 NOTE — Telephone Encounter (Signed)
Pt called in asking if he was due for procedure. I advised a questionnaire was sent to him back in 06/2022 but reports he did not receive it. Confirmed mailing address correct. Resent new questionnaire to him.

## 2023-06-05 DIAGNOSIS — I10 Essential (primary) hypertension: Secondary | ICD-10-CM | POA: Diagnosis not present

## 2023-06-05 DIAGNOSIS — Z125 Encounter for screening for malignant neoplasm of prostate: Secondary | ICD-10-CM | POA: Diagnosis not present

## 2023-06-05 DIAGNOSIS — R7301 Impaired fasting glucose: Secondary | ICD-10-CM | POA: Diagnosis not present

## 2023-06-09 DIAGNOSIS — F329 Major depressive disorder, single episode, unspecified: Secondary | ICD-10-CM | POA: Diagnosis not present

## 2023-06-09 DIAGNOSIS — F411 Generalized anxiety disorder: Secondary | ICD-10-CM | POA: Diagnosis not present

## 2023-06-09 DIAGNOSIS — M25511 Pain in right shoulder: Secondary | ICD-10-CM | POA: Diagnosis not present

## 2023-06-09 DIAGNOSIS — F5104 Psychophysiologic insomnia: Secondary | ICD-10-CM | POA: Diagnosis not present

## 2023-06-09 DIAGNOSIS — F410 Panic disorder [episodic paroxysmal anxiety] without agoraphobia: Secondary | ICD-10-CM | POA: Diagnosis not present

## 2023-06-09 DIAGNOSIS — Z0001 Encounter for general adult medical examination with abnormal findings: Secondary | ICD-10-CM | POA: Diagnosis not present

## 2023-06-09 DIAGNOSIS — J302 Other seasonal allergic rhinitis: Secondary | ICD-10-CM | POA: Diagnosis not present

## 2023-06-13 DIAGNOSIS — H04123 Dry eye syndrome of bilateral lacrimal glands: Secondary | ICD-10-CM | POA: Diagnosis not present

## 2023-06-13 DIAGNOSIS — Z961 Presence of intraocular lens: Secondary | ICD-10-CM | POA: Diagnosis not present

## 2023-06-14 DIAGNOSIS — K080 Exfoliation of teeth due to systemic causes: Secondary | ICD-10-CM | POA: Diagnosis not present

## 2023-06-15 DIAGNOSIS — R350 Frequency of micturition: Secondary | ICD-10-CM | POA: Diagnosis not present

## 2023-06-15 DIAGNOSIS — N401 Enlarged prostate with lower urinary tract symptoms: Secondary | ICD-10-CM | POA: Diagnosis not present

## 2023-06-15 DIAGNOSIS — N5201 Erectile dysfunction due to arterial insufficiency: Secondary | ICD-10-CM | POA: Diagnosis not present

## 2023-06-15 DIAGNOSIS — R972 Elevated prostate specific antigen [PSA]: Secondary | ICD-10-CM | POA: Diagnosis not present

## 2023-06-26 NOTE — Therapy (Signed)
OUTPATIENT PHYSICAL THERAPY SHOULDER EVALUATION   Patient Name: [name] MRN: [id number] DOB:[birth year], 73 y.o., male Today's Date: 06/27/2023  END OF SESSION:  PT End of Session - 06/27/23 1257     Visit Number 1    Number of Visits 8    Date for PT Re-Evaluation 07/25/23    Authorization Type BC[employer]    PT Start Time 1300    PT Stop Time 1340    PT Time Calculation (min) 40 min    Activity Tolerance Patient tolerated treatment well    Behavior During Therapy WFL for tasks assessed/performed             Past Medical History:  Diagnosis Date   Anxiety    Arthritis    BPH (benign prostatic hyperplasia)    Cancer (HCC)    SKIN   Chronic neck pain    narcotics, work injury in the 1990s, s/p multiple procedures   Constipation    Difficulty sleeping    GERD (gastroesophageal reflux disease)    not anymore   H/O jaundice    AS CHILD   Hypertension    Nasal congestion    Palpitations    "every once in a while"   PONV (postoperative nausea and vomiting)    Past Surgical History:  Procedure Laterality Date   ANTERIOR CERVICAL DECOMP/DISCECTOMY FUSION N/A 05/24/2018   Procedure: ANTERIOR CERVICAL DECOMPRESSION/DISCECTOMY FUSION, INTERBODY PROSTHESIS, PLATE/SCREWS CERVICAL THREE- CERVICAL FOUR; EXPLORE FUSION;  Surgeon: Tressie Stalker, MD;  Location: [facility] OR;  Service: Neurosurgery;  Laterality: N/A;   BIOPSY  12/31/2018   Procedure: BIOPSY;  Surgeon: West Bali, MD;  Location: [facility];  Service: Endoscopy;;   CERVICAL FUSION  1996, 1998, 2010   C6-C7, C5-C6, C4-C5   COLONOSCOPY WITH PROPOFOL N/A 07/24/2012   Dr. Darrick Penna: 5 colon polyps removed, one simple adenoma the other is hyperplastic, mild diverticulosis, small internal hemorrhoids.  Next colonoscopy 10 years   ESOPHAGOGASTRODUODENOSCOPY (EGD) WITH PROPOFOL N/A 12/31/2018   Dr. Darrick Penna: Benign-appearing esophageal stricture, nonbleeding duodenal diverticulum, mild gastritis/duodenitis.  Esophagus  dilated.  Gastric biopsy benign, no H. pylori.   HERNIA REPAIR     INGUINAL HERNIA REPAIR  11/08/2011   Procedure: HERNIA REPAIR INGUINAL ADULT;  Surgeon: Emelia Loron, MD;  Location: [facility];  Service: General;  Laterality: Right;   KNEE SURGERY  2009   right - scope   SAVORY DILATION N/A 12/31/2018   Procedure: SAVORY DILATION;  Surgeon: West Bali, MD;  Location: [facility];  Service: Endoscopy;  Laterality: N/A;   SHOULDER SURGERY  1998   right - scope   TONSILLECTOMY     Patient Active Problem List   Diagnosis Date Noted   Dysphagia 12/18/2018   Cervical spondylosis with radiculopathy 05/24/2018   Encounter for screening colonoscopy 07/11/2012   SHOULDER PAIN 12/05/2007   IMPINGEMENT SYNDROME 12/05/2007   DEGENERATIVE JOINT DISEASE, KNEE 02/22/2007   TEAR MEDIAL MENISCUS 02/22/2007    PCP: Benita Stabile, MD  REFERRING PROVIDER: Micael Hampshire, FNP  REFERRING DIAG: pain in right shoulder  THERAPY DIAG:  Right shoulder pain, unspecified chronicity - Plan: PT plan of care cert/re-cert  Rationale for Evaluation and Treatment: Rehabilitation  ONSET DATE: over a year  SUBJECTIVE:  SUBJECTIVE STATEMENT: Right shoulder pain ongoing for about a year; he has had a couple falls onto the right shoulder his dog pulling him down; pain with reaching across his body and raising his arm.  Get some popping with forearm movement and with elbow flexion and extension; sometimes bothers me with washing my hair; doing my everyday stuff is hard Hand dominance: Right  PERTINENT HISTORY: Right shoulder arthroscopy in [place], [place] in 98 Cervical fusions/surgeries C3-C7 Neuropathy in both feet up to ankles  PAIN:  Are you having pain? Yes: NPRS scale: 5 Pain location: Right shoulder Pain  description: pops Aggravating factors: moving it a certain way Relieving factors: ibuprofen, not moving it  PRECAUTIONS: None   WEIGHT BEARING RESTRICTIONS: No  FALLS:  Has patient fallen in last 6 months? No  OCCUPATION: retired  PLOF: Independent  PATIENT GOALS:make my shoulder feel better  NEXT MD VISIT:   OBJECTIVE:  Note: Objective measures were completed at Evaluation unless otherwise noted.  DIAGNOSTIC FINDINGS:  Painful right shoulder   X-rays right shoulder   X-ray shows the humeral head is high riding in the glenoid remaining articular surfaces look normal shaft looks normal acromion looks to be a type I   High riding humeral head suggestive of chronic rotator cuff disease  PATIENT SURVEYS:  Quick Dash 29.5/100 29.5%  COGNITION: Overall cognitive status: Within functional limits for tasks assessed     SENSATION: WFL  POSTURE: Forward head and shoulders  UPPER EXTREMITY ROM:   Active ROM Right eval Left eval  Shoulder flexion 142* 144  Shoulder extension    Shoulder abduction 99* 150  Shoulder adduction    Shoulder internal rotation 82 85  Shoulder external rotation 84 55*  Elbow flexion    Elbow extension    Wrist flexion    Wrist extension    Wrist ulnar deviation    Wrist radial deviation    Wrist pronation    Wrist supination    (Blank rows = not tested)  UPPER EXTREMITY MMT:  MMT Right eval Left eval  Shoulder flexion 4-* 5  Shoulder extension    Shoulder abduction 4 5  Shoulder adduction    Shoulder internal rotation 4+ 5  Shoulder external rotation 4+ 5  Middle trapezius 4+ 5  Lower trapezius    Elbow flexion 4 5  Elbow extension 4* 5  Wrist flexion    Wrist extension    Wrist ulnar deviation    Wrist radial deviation    Wrist pronation    Wrist supination    Grip strength (lbs)    (Blank rows = not tested) CERVICAL ROM:   Active ROM AROM (deg) eval  Flexion   Extension   Right lateral flexion   Left  lateral flexion   Right rotation 32  Left rotation 40   (Blank rows = not tested)   SHOULDER SPECIAL TESTS:  Rotator cuff assessment: Drop arm test: negative and Empty can test: negative   JOINT MOBILITY TESTING:  Noted right shoulder sits forward, more rounded than left  PALPATION:  Tender right upper trap, supraspinatus fossa, infraspinatus muscle belly; proximal shoulder; origin of pec  TREATMENT DATE: 06/27/23 physical therapy evaluation and HEP instruction   PATIENT EDUCATION: Education details: Patient educated on exam findings, POC, scope of PT, HEP, and what to expect next visit. Person educated: Patient Education method: Explanation, Demonstration, and Handouts Education comprehension: verbalized understanding, returned demonstration, verbal cues required, and tactile cues required  HOME EXERCISE PROGRAM: Access Code: [email or web address] URL: [email or web address] Date: 06/27/2023 Prepared by: [facility] - [facility]  Exercises - Supine Scapular Retraction  - 2 x daily - 7 x weekly - 1 sets - 10 reps - 5 sec hold - Corner Pec Major Stretch  - 2 x daily - 7 x weekly - 1 sets - 5 reps - 20 sec hold  ASSESSMENT:  CLINICAL IMPRESSION: Patient is a 73 y.o. male who was seen today for physical therapy evaluation and treatment for pain in right shoulder. Patient demonstrates decreased strength, ROM restriction, reduced flexibility, increased tenderness to palpation and postural abnormalities which are likely contributing to symptoms of pain and are negatively impacting patient ability to perform ADLs. Patient will benefit from skilled physical therapy services to address these deficits to reduce pain and improve level of function with ADLs   OBJECTIVE IMPAIRMENTS: decreased activity tolerance, decreased mobility, decreased ROM, decreased strength, increased  fascial restrictions, impaired perceived functional ability, and pain.   ACTIVITY LIMITATIONS: carrying, lifting, reach over head, hygiene/grooming, and caring for others  PARTICIPATION LIMITATIONS: meal prep, cleaning, laundry, and yard work   [facility] POTENTIAL: Good  CLINICAL DECISION MAKING: Stable/uncomplicated  EVALUATION COMPLEXITY: Low   GOALS: Goals reviewed with patient? No  SHORT TERM GOALS: Target date: 07/11/2023  patient will be independent with initial HEP  Baseline: Goal status: INITIAL  2.  Patient will self report 30% improvement to improve tolerance for functional activity  Baseline:  Goal status: INITIAL  LONG TERM GOALS: Target date: 07/25/2023  Patient will be independent in self management strategies to improve quality of life and functional outcomes.  Baseline:  Goal status: INITIAL  2.  Patient will self report 50% improvement to improve tolerance for functional activity  Baseline:  Goal status: INITIAL  3.  Patient will increase right shoulder elevation by 20 degrees to improve ability to reach overhead to wash his head Baseline: see above Goal status: INITIAL  4.  Patient will improve her QDASH score to 20% or less to demonstrate improved perceived functional mobility of the right shoulder Baseline: 29.5% Goal status: INITIAL   PLAN:  PT FREQUENCY: 2x/week  PT DURATION: 4 weeks  PLANNED INTERVENTIONS: 97164- PT Re-evaluation, 97110-Therapeutic exercises, 97530- Therapeutic activity, 97112- Neuromuscular re-education, 97535- Self Care, 64332- Manual therapy, [phone]- Gait training, [phone]- Orthotic Fit/training, [phone]- Canalith repositioning, [id number]- Aquatic Therapy, [phone]- Splinting, Patient/Family education, Balance training, Stair training, Taping, Dry Needling, Joint mobilization, Joint manipulation, Spinal manipulation, Spinal mobilization, Scar mobilization, and DME instructions.   PLAN FOR NEXT SESSION: Review HEP and goals; right  shoulder mobility and then strength as able  2:11 PM, 06/27/23 Elfie Costanza Small Jerrik Housholder MPT [facility] [place] [phone] [phone]  [employer] [facility] Authorization Request  Visit Dx Codes: M25.511  Functional Tool Score: QDASH 29.5%  For all possible CPT codes, reference the Planned Interventions line above.     Check all conditions that are expected to impact treatment: {Conditions expected to impact treatment:None of these apply

## 2023-06-27 ENCOUNTER — Ambulatory Visit (HOSPITAL_COMMUNITY): Payer: Medicare Other | Attending: Internal Medicine

## 2023-06-27 ENCOUNTER — Other Ambulatory Visit: Payer: Self-pay

## 2023-06-27 DIAGNOSIS — M25511 Pain in right shoulder: Secondary | ICD-10-CM | POA: Insufficient documentation

## 2023-07-04 ENCOUNTER — Ambulatory Visit (HOSPITAL_COMMUNITY): Payer: Medicare Other

## 2023-07-04 DIAGNOSIS — M25511 Pain in right shoulder: Secondary | ICD-10-CM

## 2023-07-04 NOTE — Therapy (Signed)
OUTPATIENT PHYSICAL THERAPY SHOULDER EVALUATION   Patient Name: [name] MRN: [id number] DOB:Oct 19, 1950, 73 y.o., male Today's Date: 07/04/2023  END OF SESSION:  PT End of Session - 07/04/23 1348     Visit Number 2    Number of Visits 8    Date for PT Re-Evaluation 07/25/23    Authorization Type BCBS    PT Start Time 0145    PT Stop Time 0225    PT Time Calculation (min) 40 min    Activity Tolerance Patient tolerated treatment well    Behavior During Therapy WFL for tasks assessed/performed             Past Medical History:  Diagnosis Date   Anxiety    Arthritis    BPH (benign prostatic hyperplasia)    Cancer (HCC)    SKIN   Chronic neck pain    narcotics, work injury in the 1990s, s/p multiple procedures   Constipation    Difficulty sleeping    GERD (gastroesophageal reflux disease)    not anymore   H/O jaundice    AS CHILD   Hypertension    Nasal congestion    Palpitations    "every once in a while"   PONV (postoperative nausea and vomiting)    Past Surgical History:  Procedure Laterality Date   ANTERIOR CERVICAL DECOMP/DISCECTOMY FUSION N/A 05/24/2018   Procedure: ANTERIOR CERVICAL DECOMPRESSION/DISCECTOMY FUSION, INTERBODY PROSTHESIS, PLATE/SCREWS CERVICAL THREE- CERVICAL FOUR; EXPLORE FUSION;  Surgeon: Tressie Stalker, MD;  Location: [facility] OR;  Service: Neurosurgery;  Laterality: N/A;   BIOPSY  12/31/2018   Procedure: BIOPSY;  Surgeon: West Bali, MD;  Location: [facility];  Service: Endoscopy;;   CERVICAL FUSION  1996, 1998, 2010   C6-C7, C5-C6, C4-C5   COLONOSCOPY WITH PROPOFOL N/A 07/24/2012   Dr. Darrick Penna: 5 colon polyps removed, one simple adenoma the other is hyperplastic, mild diverticulosis, small internal hemorrhoids.  Next colonoscopy 10 years   ESOPHAGOGASTRODUODENOSCOPY (EGD) WITH PROPOFOL N/A 12/31/2018   Dr. Darrick Penna: Benign-appearing esophageal stricture, nonbleeding duodenal diverticulum, mild gastritis/duodenitis.  Esophagus  dilated.  Gastric biopsy benign, no H. pylori.   HERNIA REPAIR     INGUINAL HERNIA REPAIR  11/08/2011   Procedure: HERNIA REPAIR INGUINAL ADULT;  Surgeon: Emelia Loron, MD;  Location: [facility];  Service: General;  Laterality: Right;   KNEE SURGERY  2009   right - scope   SAVORY DILATION N/A 12/31/2018   Procedure: SAVORY DILATION;  Surgeon: West Bali, MD;  Location: [facility];  Service: Endoscopy;  Laterality: N/A;   SHOULDER SURGERY  1998   right - scope   TONSILLECTOMY     Patient Active Problem List   Diagnosis Date Noted   Dysphagia 12/18/2018   Cervical spondylosis with radiculopathy 05/24/2018   Encounter for screening colonoscopy 07/11/2012   SHOULDER PAIN 12/05/2007   IMPINGEMENT SYNDROME 12/05/2007   DEGENERATIVE JOINT DISEASE, KNEE 02/22/2007   TEAR MEDIAL MENISCUS 02/22/2007    PCP: Benita Stabile, MD  REFERRING PROVIDER: Micael Hampshire, FNP  REFERRING DIAG: pain in right shoulder  THERAPY DIAG:  Right shoulder pain, unspecified chronicity  Rationale for Evaluation and Treatment: Rehabilitation  ONSET DATE: over a year  SUBJECTIVE:  SUBJECTIVE STATEMENT: Today; Pain is 6/10 in right shoulder    Right shoulder pain ongoing for about a year; he has had a couple falls onto the right shoulder his dog pulling him down; pain with reaching across his body and raising his arm.  Get some popping with forearm movement and with elbow flexion and extension; sometimes bothers me with washing my hair; doing my everyday stuff is hard Hand dominance: Right  PERTINENT HISTORY: Right shoulder arthroscopy in [place], [place] in 98 Cervical fusions/surgeries C3-C7 Neuropathy in both feet up to ankles  PAIN:  Are you having pain? Yes: NPRS scale: 5 Pain location: Right shoulder Pain  description: pops Aggravating factors: moving it a certain way Relieving factors: ibuprofen, not moving it  PRECAUTIONS: None   WEIGHT BEARING RESTRICTIONS: No  FALLS:  Has patient fallen in last 6 months? No  OCCUPATION: retired  PLOF: Independent  PATIENT GOALS:make my shoulder feel better  NEXT MD VISIT:   OBJECTIVE:  Note: Objective measures were completed at Evaluation unless otherwise noted.  DIAGNOSTIC FINDINGS:  Painful right shoulder   X-rays right shoulder   X-ray shows the humeral head is high riding in the glenoid remaining articular surfaces look normal shaft looks normal acromion looks to be a type I   High riding humeral head suggestive of chronic rotator cuff disease  PATIENT SURVEYS:  Quick Dash 29.5/100 29.5%  COGNITION: Overall cognitive status: Within functional limits for tasks assessed     SENSATION: WFL  POSTURE: Forward head and shoulders  UPPER EXTREMITY ROM:   Active ROM Right eval Left eval  Shoulder flexion 142* 144  Shoulder extension    Shoulder abduction 99* 150  Shoulder adduction    Shoulder internal rotation 82 85  Shoulder external rotation 84 55*  Elbow flexion    Elbow extension    Wrist flexion    Wrist extension    Wrist ulnar deviation    Wrist radial deviation    Wrist pronation    Wrist supination    (Blank rows = not tested)  UPPER EXTREMITY MMT:  MMT Right eval Left eval  Shoulder flexion 4-* 5  Shoulder extension    Shoulder abduction 4 5  Shoulder adduction    Shoulder internal rotation 4+ 5  Shoulder external rotation 4+ 5  Middle trapezius 4+ 5  Lower trapezius    Elbow flexion 4 5  Elbow extension 4* 5  Wrist flexion    Wrist extension    Wrist ulnar deviation    Wrist radial deviation    Wrist pronation    Wrist supination    Grip strength (lbs)    (Blank rows = not tested) CERVICAL ROM:   Active ROM AROM (deg) eval  Flexion   Extension   Right lateral flexion   Left  lateral flexion   Right rotation 32  Left rotation 40   (Blank rows = not tested)   SHOULDER SPECIAL TESTS:  Rotator cuff assessment: Drop arm test: negative and Empty can test: negative   JOINT MOBILITY TESTING:  Noted right shoulder sits forward, more rounded than left  PALPATION:  Tender right upper trap, supraspinatus fossa, infraspinatus muscle belly; proximal shoulder; origin of pec  TREATMENT DATE:   07/04/23 Supine  Manual therapy to right shoulder: soft tissue mobilization to right pec, Lats, IR's. PROM of shoulder, instrument-assisted soft tissue mobilization to right pecs, AP glides to GHJ   Supine stick press, shoulder flexion (2#) x10 each  Right shoulder ER with right theraband x10 Left sideyling  ER with 1# 2x10  Abduction with 1# 2x10 UBE retro 1'     06/27/23 physical therapy evaluation and HEP instruction   PATIENT EDUCATION: Education details: Patient educated on exam findings, POC, scope of PT, HEP, and what to expect next visit. Person educated: Patient Education method: Explanation, Demonstration, and Handouts Education comprehension: verbalized understanding, returned demonstration, verbal cues required, and tactile cues required  HOME EXERCISE PROGRAM: Access Code: [email or web address] URL: [email or web address] Date: 06/27/2023 Prepared by: [facility] - Rehab  Exercises - Supine Scapular Retraction  - 2 x daily - 7 x weekly - 1 sets - 10 reps - 5 sec hold - Corner Pec Major Stretch  - 2 x daily - 7 x weekly - 1 sets - 5 reps - 20 sec hold  ASSESSMENT:  CLINICAL IMPRESSION: Today: PT initiated basic upper extremity active range of motion and strength. PT began session with manual therapy to right shoulder to improve PROM, decrease muscle tightness. PT then proceeded with upper extremity therapeutic exercise. PT tolerated well  with minimal discomfort with therapeutic exercise.Patient will benefit from skilled physical therapy services to address these deficits to reduce pain and improve level of function with ADLs   Patient is a 73 y.o. male who was seen today for physical therapy evaluation and treatment for pain in right shoulder. Patient demonstrates decreased strength, ROM restriction, reduced flexibility, increased tenderness to palpation and postural abnormalities which are likely contributing to symptoms of pain and are negatively impacting patient ability to perform ADLs. Patient will benefit from skilled physical therapy services to address these deficits to reduce pain and improve level of function with ADLs   OBJECTIVE IMPAIRMENTS: decreased activity tolerance, decreased mobility, decreased ROM, decreased strength, increased fascial restrictions, impaired perceived functional ability, and pain.   ACTIVITY LIMITATIONS: carrying, lifting, reach over head, hygiene/grooming, and caring for others  PARTICIPATION LIMITATIONS: meal prep, cleaning, laundry, and yard work   [facility] POTENTIAL: Good  CLINICAL DECISION MAKING: Stable/uncomplicated  EVALUATION COMPLEXITY: Low   GOALS: Goals reviewed with patient? No  SHORT TERM GOALS: Target date: 07/11/2023  patient will be independent with initial HEP  Baseline: Goal status: INITIAL  2.  Patient will self report 30% improvement to improve tolerance for functional activity  Baseline:  Goal status: INITIAL  LONG TERM GOALS: Target date: 07/25/2023  Patient will be independent in self management strategies to improve quality of life and functional outcomes.  Baseline:  Goal status: INITIAL  2.  Patient will self report 50% improvement to improve tolerance for functional activity  Baseline:  Goal status: INITIAL  3.  Patient will increase right shoulder elevation by 20 degrees to improve ability to reach overhead to wash his head Baseline: see  above Goal status: INITIAL  4.  Patient will improve her QDASH score to 20% or less to demonstrate improved perceived functional mobility of the right shoulder Baseline: 29.5% Goal status: INITIAL   PLAN:  PT FREQUENCY: 2x/week  PT DURATION: 4 weeks  PLANNED INTERVENTIONS: 97164- PT Re-evaluation, 97110-Therapeutic exercises, 97530- Therapeutic activity, [id number]- Neuromuscular re-education, 97535- Self Care, [id number]- Manual therapy, [id number]- Gait training, [phone]- Orthotic Fit/training, [phone]- Canalith repositioning, [id number]- Aquatic Therapy, [phone]- Splinting, Patient/Family education,  Balance training, Stair training, Taping, Dry Needling, Joint mobilization, Joint manipulation, Spinal manipulation, Spinal mobilization, Scar mobilization, and DME instructions.   PLAN FOR NEXT SESSION: Review HEP and goals; right shoulder mobility and then strength as able  Seymour Bars PT, DPT  1:49 PM, 07/04/23

## 2023-07-07 ENCOUNTER — Ambulatory Visit (HOSPITAL_COMMUNITY): Payer: Medicare Other

## 2023-07-07 DIAGNOSIS — M25511 Pain in right shoulder: Secondary | ICD-10-CM | POA: Diagnosis not present

## 2023-07-07 NOTE — Therapy (Signed)
OUTPATIENT PHYSICAL THERAPY SHOULDER EVALUATION   Patient Name: [name] MRN: [id number] DOB:11-Apr-1951, 73 y.o., male Today's Date: 07/07/2023  END OF SESSION:  PT End of Session - 07/07/23 1303     Visit Number 3    Number of Visits 8    Date for PT Re-Evaluation 07/25/23    Authorization Type BCBS    PT Start Time 0100    PT Stop Time 0140    PT Time Calculation (min) 40 min    Activity Tolerance Patient tolerated treatment well    Behavior During Therapy WFL for tasks assessed/performed             Past Medical History:  Diagnosis Date   Anxiety    Arthritis    BPH (benign prostatic hyperplasia)    Cancer (HCC)    SKIN   Chronic neck pain    narcotics, work injury in the 1990s, s/p multiple procedures   Constipation    Difficulty sleeping    GERD (gastroesophageal reflux disease)    not anymore   H/O jaundice    AS CHILD   Hypertension    Nasal congestion    Palpitations    "every once in a while"   PONV (postoperative nausea and vomiting)    Past Surgical History:  Procedure Laterality Date   ANTERIOR CERVICAL DECOMP/DISCECTOMY FUSION N/A 05/24/2018   Procedure: ANTERIOR CERVICAL DECOMPRESSION/DISCECTOMY FUSION, INTERBODY PROSTHESIS, PLATE/SCREWS CERVICAL THREE- CERVICAL FOUR; EXPLORE FUSION;  Surgeon: Tressie Stalker, MD;  Location: [facility] OR;  Service: Neurosurgery;  Laterality: N/A;   BIOPSY  12/31/2018   Procedure: BIOPSY;  Surgeon: West Bali, MD;  Location: [facility];  Service: Endoscopy;;   CERVICAL FUSION  1996, 1998, 2010   C6-C7, C5-C6, C4-C5   COLONOSCOPY WITH PROPOFOL N/A 07/24/2012   Dr. Darrick Penna: 5 colon polyps removed, one simple adenoma the other is hyperplastic, mild diverticulosis, small internal hemorrhoids.  Next colonoscopy 10 years   ESOPHAGOGASTRODUODENOSCOPY (EGD) WITH PROPOFOL N/A 12/31/2018   Dr. Darrick Penna: Benign-appearing esophageal stricture, nonbleeding duodenal diverticulum, mild gastritis/duodenitis.  Esophagus  dilated.  Gastric biopsy benign, no H. pylori.   HERNIA REPAIR     INGUINAL HERNIA REPAIR  11/08/2011   Procedure: HERNIA REPAIR INGUINAL ADULT;  Surgeon: Emelia Loron, MD;  Location: [facility];  Service: General;  Laterality: Right;   KNEE SURGERY  2009   right - scope   SAVORY DILATION N/A 12/31/2018   Procedure: SAVORY DILATION;  Surgeon: West Bali, MD;  Location: [facility];  Service: Endoscopy;  Laterality: N/A;   SHOULDER SURGERY  1998   right - scope   TONSILLECTOMY     Patient Active Problem List   Diagnosis Date Noted   Dysphagia 12/18/2018   Cervical spondylosis with radiculopathy 05/24/2018   Encounter for screening colonoscopy 07/11/2012   SHOULDER PAIN 12/05/2007   IMPINGEMENT SYNDROME 12/05/2007   DEGENERATIVE JOINT DISEASE, KNEE 02/22/2007   TEAR MEDIAL MENISCUS 02/22/2007    PCP: Benita Stabile, MD  REFERRING PROVIDER: Micael Hampshire, FNP  REFERRING DIAG: pain in right shoulder  THERAPY DIAG:  Right shoulder pain, unspecified chronicity  Rationale for Evaluation and Treatment: Rehabilitation  ONSET DATE: over a year  SUBJECTIVE:  SUBJECTIVE STATEMENT: Today: Pain is 5/10 in right shoulder    Right shoulder pain ongoing for about a year; he has had a couple falls onto the right shoulder his dog pulling him down; pain with reaching across his body and raising his arm.  Get some popping with forearm movement and with elbow flexion and extension; sometimes bothers me with washing my hair; doing my everyday stuff is hard Hand dominance: Right  PERTINENT HISTORY: Right shoulder arthroscopy in [place], [place] in 98 Cervical fusions/surgeries C3-C7 Neuropathy in both feet up to ankles  PAIN:  Are you having pain? Yes: NPRS scale: 5 Pain location: Right shoulder Pain  description: pops Aggravating factors: moving it a certain way Relieving factors: ibuprofen, not moving it  PRECAUTIONS: None   WEIGHT BEARING RESTRICTIONS: No  FALLS:  Has patient fallen in last 6 months? No  OCCUPATION: retired  PLOF: Independent  PATIENT GOALS:make my shoulder feel better  NEXT MD VISIT:   OBJECTIVE:  Note: Objective measures were completed at Evaluation unless otherwise noted.  DIAGNOSTIC FINDINGS:  Painful right shoulder   X-rays right shoulder   X-ray shows the humeral head is high riding in the glenoid remaining articular surfaces look normal shaft looks normal acromion looks to be a type I   High riding humeral head suggestive of chronic rotator cuff disease  PATIENT SURVEYS:  Quick Dash 29.5/100 29.5%  COGNITION: Overall cognitive status: Within functional limits for tasks assessed     SENSATION: WFL  POSTURE: Forward head and shoulders  UPPER EXTREMITY ROM:   Active ROM Right eval Left eval  Shoulder flexion 142* 144  Shoulder extension    Shoulder abduction 99* 150  Shoulder adduction    Shoulder internal rotation 82 85  Shoulder external rotation 84 55*  Elbow flexion    Elbow extension    Wrist flexion    Wrist extension    Wrist ulnar deviation    Wrist radial deviation    Wrist pronation    Wrist supination    (Blank rows = not tested)  UPPER EXTREMITY MMT:  MMT Right eval Left eval  Shoulder flexion 4-* 5  Shoulder extension    Shoulder abduction 4 5  Shoulder adduction    Shoulder internal rotation 4+ 5  Shoulder external rotation 4+ 5  Middle trapezius 4+ 5  Lower trapezius    Elbow flexion 4 5  Elbow extension 4* 5  Wrist flexion    Wrist extension    Wrist ulnar deviation    Wrist radial deviation    Wrist pronation    Wrist supination    Grip strength (lbs)    (Blank rows = not tested) CERVICAL ROM:   Active ROM AROM (deg) eval  Flexion   Extension   Right lateral flexion   Left  lateral flexion   Right rotation 32  Left rotation 40   (Blank rows = not tested)   SHOULDER SPECIAL TESTS:  Rotator cuff assessment: Drop arm test: negative and Empty can test: negative   JOINT MOBILITY TESTING:  Noted right shoulder sits forward, more rounded than left  PALPATION:  Tender right upper trap, supraspinatus fossa, infraspinatus muscle belly; proximal shoulder; origin of pec  TREATMENT DATE:   07/07/23 Supine  Manual therapy to right shoulder:  Instrument-assisted soft tissue mobilization/ soft tissue mobilization to right pec, Lats, IR's. PROM of shoulder,  Mobilizations including AP glides to GHJ, thoracic spine Grade 3-4 for ROM Supine stick flexion with 2# 2x10 Seated cervical/thoracic Mobilization with movement/ SNAGS into extension, rotation  UBE retro 1'   07/04/23 Supine  Manual therapy to right shoulder: soft tissue mobilization to right pec, Lats, IR's. PROM of shoulder, instrument-assisted soft tissue mobilization to right pecs, AP glides to GHJ   Supine stick press, shoulder flexion (2#) x10 each  Right shoulder ER with right theraband x10 Left sideyling  ER with 1# 2x10  Abduction with 1# 2x10 UBE retro 1'     06/27/23 physical therapy evaluation and HEP instruction   PATIENT EDUCATION: Education details: Patient educated on exam findings, POC, scope of PT, HEP, and what to expect next visit. Person educated: Patient Education method: Explanation, Demonstration, and Handouts Education comprehension: verbalized understanding, returned demonstration, verbal cues required, and tactile cues required  HOME EXERCISE PROGRAM: Access Code: [email or web address] URL: [email or web address] Date: 06/27/2023 Prepared by: [facility] - Rehab  Exercises - Supine Scapular Retraction  - 2 x daily - 7 x weekly - 1 sets - 10 reps - 5 sec  hold - Corner Pec Major Stretch  - 2 x daily - 7 x weekly - 1 sets - 5 reps - 20 sec hold  ASSESSMENT:  CLINICAL IMPRESSION: Today: PT continued with manual therapy to right shoulder to improve PROM, decrease muscle tightness. Patient with moderate tenderness to palpation right periscapular ms during manual therapy. PT then proceeded with upper extremity therapeutic exercise. PT tolerated well with minimal discomfort with therapeutic exercise.Patient will benefit from skilled physical therapy services to address these deficits to reduce pain and improve level of function with ADLs   Patient is a 73 y.o. male who was seen today for physical therapy evaluation and treatment for pain in right shoulder. Patient demonstrates decreased strength, ROM restriction, reduced flexibility, increased tenderness to palpation and postural abnormalities which are likely contributing to symptoms of pain and are negatively impacting patient ability to perform ADLs. Patient will benefit from skilled physical therapy services to address these deficits to reduce pain and improve level of function with ADLs   OBJECTIVE IMPAIRMENTS: decreased activity tolerance, decreased mobility, decreased ROM, decreased strength, increased fascial restrictions, impaired perceived functional ability, and pain.   ACTIVITY LIMITATIONS: carrying, lifting, reach over head, hygiene/grooming, and caring for others  PARTICIPATION LIMITATIONS: meal prep, cleaning, laundry, and yard work   [facility] POTENTIAL: Good  CLINICAL DECISION MAKING: Stable/uncomplicated  EVALUATION COMPLEXITY: Low   GOALS: Goals reviewed with patient? No  SHORT TERM GOALS: Target date: 07/11/2023  patient will be independent with initial HEP  Baseline: Goal status: INITIAL  2.  Patient will self report 30% improvement to improve tolerance for functional activity  Baseline:  Goal status: INITIAL  LONG TERM GOALS: Target date: 07/25/2023  Patient will  be independent in self management strategies to improve quality of life and functional outcomes.  Baseline:  Goal status: INITIAL  2.  Patient will self report 50% improvement to improve tolerance for functional activity  Baseline:  Goal status: INITIAL  3.  Patient will increase right shoulder elevation by 20 degrees to improve ability to reach overhead to wash his head Baseline: see above Goal status: INITIAL  4.  Patient will improve her QDASH score to 20% or less to demonstrate improved perceived functional  mobility of the right shoulder Baseline: 29.5% Goal status: INITIAL   PLAN:  PT FREQUENCY: 2x/week  PT DURATION: 4 weeks  PLANNED INTERVENTIONS: 97164- PT Re-evaluation, 97110-Therapeutic exercises, 97530- Therapeutic activity, 97112- Neuromuscular re-education, 97535- Self Care, 40981- Manual therapy, [phone]- Gait training, [phone]- Orthotic Fit/training, [phone]- Canalith repositioning, [id number]- Aquatic Therapy, [phone]- Splinting, Patient/Family education, Balance training, Stair training, Taping, Dry Needling, Joint mobilization, Joint manipulation, Spinal manipulation, Spinal mobilization, Scar mobilization, and DME instructions.   PLAN FOR NEXT SESSION: Right shoulder mobility and then strength as able  Seymour Bars PT, DPT  1:04 PM, 07/07/23

## 2023-07-11 ENCOUNTER — Encounter (HOSPITAL_COMMUNITY): Payer: Medicare Other | Admitting: Physical Therapy

## 2023-07-13 ENCOUNTER — Ambulatory Visit (HOSPITAL_COMMUNITY): Payer: Medicare Other | Attending: Internal Medicine | Admitting: Physical Therapy

## 2023-07-13 DIAGNOSIS — M25511 Pain in right shoulder: Secondary | ICD-10-CM | POA: Insufficient documentation

## 2023-07-13 NOTE — Therapy (Signed)
 OUTPATIENT PHYSICAL THERAPY TREATMENT   Patient Name: [name] MRN: [id number] DOB:[birth year], 73 y.o., male Today's Date: 07/13/2023  END OF SESSION:  PT End of Session - 07/13/23 1441     Visit Number 4    Number of Visits 8    Date for PT Re-Evaluation 07/25/23    Authorization Type BCBS    PT Start Time 1436    PT Stop Time 1514    PT Time Calculation (min) 38 min    Activity Tolerance Patient tolerated treatment well    Behavior During Therapy WFL for tasks assessed/performed              Past Medical History:  Diagnosis Date   Anxiety    Arthritis    BPH (benign prostatic hyperplasia)    Cancer (HCC)    SKIN   Chronic neck pain    narcotics, work injury in the 1990s, s/p multiple procedures   Constipation    Difficulty sleeping    GERD (gastroesophageal reflux disease)    not anymore   H/O jaundice    AS CHILD   Hypertension    Nasal congestion    Palpitations    every once in a while   PONV (postoperative nausea and vomiting)    Past Surgical History:  Procedure Laterality Date   ANTERIOR CERVICAL DECOMP/DISCECTOMY FUSION N/A 05/24/2018   Procedure: ANTERIOR CERVICAL DECOMPRESSION/DISCECTOMY FUSION, INTERBODY PROSTHESIS, PLATE/SCREWS CERVICAL THREE- CERVICAL FOUR; EXPLORE FUSION;  Surgeon: Mavis Purchase, MD;  Location: [facility] OR;  Service: Neurosurgery;  Laterality: N/A;   BIOPSY  12/31/2018   Procedure: BIOPSY;  Surgeon: Harvey Margo CROME, MD;  Location: [facility];  Service: Endoscopy;;   CERVICAL FUSION  1996, 1998, 2010   C6-C7, C5-C6, C4-C5   COLONOSCOPY WITH PROPOFOL  N/A 07/24/2012   Dr. Harvey: 5 colon polyps removed, one simple adenoma the other is hyperplastic, mild diverticulosis, small internal hemorrhoids.  Next colonoscopy 10 years   ESOPHAGOGASTRODUODENOSCOPY (EGD) WITH PROPOFOL  N/A 12/31/2018   Dr. Harvey: Benign-appearing esophageal stricture, nonbleeding duodenal diverticulum, mild gastritis/duodenitis.  Esophagus dilated.   Gastric biopsy benign, no H. pylori.   HERNIA REPAIR     INGUINAL HERNIA REPAIR  11/08/2011   Procedure: HERNIA REPAIR INGUINAL ADULT;  Surgeon: Donnice Bury, MD;  Location: [facility];  Service: General;  Laterality: Right;   KNEE SURGERY  2009   right - scope   SAVORY DILATION N/A 12/31/2018   Procedure: SAVORY DILATION;  Surgeon: Harvey Margo CROME, MD;  Location: [facility];  Service: Endoscopy;  Laterality: N/A;   SHOULDER SURGERY  1998   right - scope   TONSILLECTOMY     Patient Active Problem List   Diagnosis Date Noted   Dysphagia 12/18/2018   Cervical spondylosis with radiculopathy 05/24/2018   Encounter for screening colonoscopy 07/11/2012   SHOULDER PAIN 12/05/2007   IMPINGEMENT SYNDROME 12/05/2007   DEGENERATIVE JOINT DISEASE, KNEE 02/22/2007   TEAR MEDIAL MENISCUS 02/22/2007    PCP: Shona Norleen PEDLAR, MD  REFERRING PROVIDER: Joeann Browning, FNP  REFERRING DIAG: pain in right shoulder  THERAPY DIAG:  Right shoulder pain, unspecified chronicity  Rationale for Evaluation and Treatment: Rehabilitation  ONSET DATE: over a year  SUBJECTIVE:  SUBJECTIVE STATEMENT: Pain is 5/10 in right shoulder and neck region which remains constant at all times.    Right shoulder pain ongoing for about a year; he has had a couple falls onto the right shoulder his dog pulling him down; pain with reaching across his body and raising his arm.  Get some popping with forearm movement and with elbow flexion and extension; sometimes bothers me with washing my hair; doing my everyday stuff is hard Hand dominance: Right  PERTINENT HISTORY: Right shoulder arthroscopy in [place], [place]  in 98 Cervical fusions/surgeries C3-C7; 5 separate surgeries Neuropathy in both feet up to ankles  PAIN:  Are you having pain?  Yes: NPRS scale: 5 Pain location: Right shoulder Pain description: pops Aggravating factors: moving it a certain way Relieving factors: ibuprofen , not moving it  PRECAUTIONS: None   WEIGHT BEARING RESTRICTIONS: No  FALLS:  Has patient fallen in last 6 months? No  OCCUPATION: retired  PLOF: Independent  PATIENT GOALS:make my shoulder feel better  NEXT MD VISIT:   OBJECTIVE:  Note: Objective measures were completed at Evaluation unless otherwise noted.  DIAGNOSTIC FINDINGS:  Painful right shoulder   X-rays right shoulder   X-ray shows the humeral head is high riding in the glenoid remaining articular surfaces look normal shaft looks normal acromion looks to be a type I   High riding humeral head suggestive of chronic rotator cuff disease  PATIENT SURVEYS:  Quick Dash 29.5/100 29.5%  COGNITION: Overall cognitive status: Within functional limits for tasks assessed     SENSATION: WFL  POSTURE: Forward head and shoulders  UPPER EXTREMITY ROM:   Active ROM Right eval Left eval  Shoulder flexion 142* 144  Shoulder extension    Shoulder abduction 99* 150  Shoulder adduction    Shoulder internal rotation 82 85  Shoulder external rotation 84 55*  Elbow flexion    Elbow extension    Wrist flexion    Wrist extension    Wrist ulnar deviation    Wrist radial deviation    Wrist pronation    Wrist supination    (Blank rows = not tested)  UPPER EXTREMITY MMT:  MMT Right eval Left eval  Shoulder flexion 4-* 5  Shoulder extension    Shoulder abduction 4 5  Shoulder adduction    Shoulder internal rotation 4+ 5  Shoulder external rotation 4+ 5  Middle trapezius 4+ 5  Lower trapezius    Elbow flexion 4 5  Elbow extension 4* 5  Wrist flexion    Wrist extension    Wrist ulnar deviation    Wrist radial deviation    Wrist pronation    Wrist supination    Grip strength (lbs)    (Blank rows = not tested) CERVICAL ROM:   Active ROM AROM (deg) eval   Flexion   Extension   Right lateral flexion   Left lateral flexion   Right rotation 32  Left rotation 40   (Blank rows = not tested)   SHOULDER SPECIAL TESTS:  Rotator cuff assessment: Drop arm test: negative and Empty can test: negative   JOINT MOBILITY TESTING:  Noted right shoulder sits forward, more rounded than left  PALPATION:  Tender right upper trap, supraspinatus fossa, infraspinatus muscle belly; proximal shoulder; origin of pec  TREATMENT DATE: 07/13/23 UBE 4 minutes level 1 backward  Pulleys 2'flex, 2'abd Rt UE Doorway stretch 2X30 Supine Rt wand flexion 10X  Rt UE abduction 10X  Rt ER 10X PROM and manual therapy to Rt shoulder  07/07/23 Supine  Manual therapy to right shoulder:  Instrument-assisted soft tissue mobilization/ soft tissue mobilization to right pec, Lats, IR's. PROM of shoulder,  Mobilizations including AP glides to GHJ, thoracic spine Grade 3-4 for ROM Supine stick flexion with 2# 2x10 Seated cervical/thoracic Mobilization with movement/ SNAGS into extension, rotation  UBE retro 1'   07/04/23 Supine  Manual therapy to right shoulder: soft tissue mobilization to right pec, Lats, IR's. PROM of shoulder, instrument-assisted soft tissue mobilization to right pecs, AP glides to GHJ   Supine stick press, shoulder flexion (2#) x10 each  Right shoulder ER with right theraband x10 Left sideyling  ER with 1# 2x10  Abduction with 1# 2x10 UBE retro 1'     06/27/23 physical therapy evaluation and HEP instruction   PATIENT EDUCATION: Education details: Patient educated on exam findings, POC, scope of PT, HEP, and what to expect next visit. Person educated: Patient Education method: Explanation, Demonstration, and Handouts Education comprehension: verbalized understanding, returned demonstration, verbal cues required, and  tactile cues required  HOME EXERCISE PROGRAM: Access Code: [email or web address] URL: [email or web address] Date: 06/27/2023 Prepared by: [facility] - Rehab  Exercises - Supine Scapular Retraction  - 2 x daily - 7 x weekly - 1 sets - 10 reps - 5 sec hold - Corner Pec Major Stretch  - 2 x daily - 7 x weekly - 1 sets - 5 reps - 20 sec hold  ASSESSMENT:  CLINICAL IMPRESSION: Continued with focus on improving Rt shoulder AROM.  Began with UBE for mm warmup an added pulleys.  Pt was able to demonstrate near full ROM with seated pulley activity with little challenge.  Tightness and tenderness throughout Rt periscapular musculature during manual therapy.  Encouraged to continue to work on these at home.  ROM is improving overall. PT tolerated well with minimal discomfort.   Patient is a 73 y.o. male who was seen today for physical therapy evaluation and treatment for pain in right shoulder. Patient demonstrates decreased strength, ROM restriction, reduced flexibility, increased tenderness to palpation and postural abnormalities which are likely contributing to symptoms of pain and are negatively impacting patient ability to perform ADLs. Patient will benefit from skilled physical therapy services to address these deficits to reduce pain and improve level of function with ADLs   OBJECTIVE IMPAIRMENTS: decreased activity tolerance, decreased mobility, decreased ROM, decreased strength, increased fascial restrictions, impaired perceived functional ability, and pain.   ACTIVITY LIMITATIONS: carrying, lifting, reach over head, hygiene/grooming, and caring for others  PARTICIPATION LIMITATIONS: meal prep, cleaning, laundry, and yard work   [facility] POTENTIAL: Good  CLINICAL DECISION MAKING: Stable/uncomplicated  EVALUATION COMPLEXITY: Low   GOALS: Goals reviewed with patient? No  SHORT TERM GOALS: Target date: 07/11/2023  patient will be independent with initial HEP  Baseline: Goal status:  INITIAL  2.  Patient will self report 30% improvement to improve tolerance for functional activity  Baseline:  Goal status: INITIAL  LONG TERM GOALS: Target date: 07/25/2023  Patient will be independent in self management strategies to improve quality of life and functional outcomes.  Baseline:  Goal status: INITIAL  2.  Patient will self report 50% improvement to improve tolerance for functional activity  Baseline:  Goal status: INITIAL  3.  Patient will increase right shoulder elevation  by 20 degrees to improve ability to reach overhead to wash his head Baseline: see above Goal status: INITIAL  4.  Patient will improve her QDASH score to 20% or less to demonstrate improved perceived functional mobility of the right shoulder Baseline: 29.5% Goal status: INITIAL   PLAN:  PT FREQUENCY: 2x/week  PT DURATION: 4 weeks  PLANNED INTERVENTIONS: 97164- PT Re-evaluation, 97110-Therapeutic exercises, 97530- Therapeutic activity, 97112- Neuromuscular re-education, 97535- Self Care, [id number]- Manual therapy, [phone]- Gait training, [phone]- Orthotic Fit/training, [phone]- Canalith repositioning, [id number]- Aquatic Therapy, [phone]- Splinting, Patient/Family education, Balance training, Stair training, Taping, Dry Needling, Joint mobilization, Joint manipulation, Spinal manipulation, Spinal mobilization, Scar mobilization, and DME instructions.   PLAN FOR NEXT SESSION: Right shoulder mobility and then strength as able  Greig KATHEE Fuse, PTA/CLT [facility] Ph: [phone] 2:43 PM, 07/13/23

## 2023-07-18 ENCOUNTER — Ambulatory Visit (HOSPITAL_COMMUNITY): Payer: Medicare Other | Admitting: Physical Therapy

## 2023-07-18 DIAGNOSIS — M25511 Pain in right shoulder: Secondary | ICD-10-CM

## 2023-07-18 NOTE — Therapy (Signed)
OUTPATIENT PHYSICAL THERAPY TREATMENT   Patient Name: [name] MRN: [id number] DOB:08-05-1950, 73 y.o., male Today's Date: 07/18/2023  END OF SESSION:  PT End of Session - 07/18/23 1430     Visit Number 5    Number of Visits 8    Date for PT Re-Evaluation 07/25/23    Authorization Type BCBS    PT Start Time 1349    PT Stop Time 1430    PT Time Calculation (min) 41 min    Activity Tolerance Patient tolerated treatment well    Behavior During Therapy WFL for tasks assessed/performed               Past Medical History:  Diagnosis Date   Anxiety    Arthritis    BPH (benign prostatic hyperplasia)    Cancer (HCC)    SKIN   Chronic neck pain    narcotics, work injury in the 1990s, s/p multiple procedures   Constipation    Difficulty sleeping    GERD (gastroesophageal reflux disease)    not anymore   H/O jaundice    AS CHILD   Hypertension    Nasal congestion    Palpitations    "every once in a while"   PONV (postoperative nausea and vomiting)    Past Surgical History:  Procedure Laterality Date   ANTERIOR CERVICAL DECOMP/DISCECTOMY FUSION N/A 05/24/2018   Procedure: ANTERIOR CERVICAL DECOMPRESSION/DISCECTOMY FUSION, INTERBODY PROSTHESIS, PLATE/SCREWS CERVICAL THREE- CERVICAL FOUR; EXPLORE FUSION;  Surgeon: Tressie Stalker, MD;  Location: [facility] OR;  Service: Neurosurgery;  Laterality: N/A;   BIOPSY  12/31/2018   Procedure: BIOPSY;  Surgeon: West Bali, MD;  Location: [facility];  Service: Endoscopy;;   CERVICAL FUSION  1996, 1998, 2010   C6-C7, C5-C6, C4-C5   COLONOSCOPY WITH PROPOFOL N/A 07/24/2012   Dr. Darrick Penna: 5 colon polyps removed, one simple adenoma the other is hyperplastic, mild diverticulosis, small internal hemorrhoids.  Next colonoscopy 10 years   ESOPHAGOGASTRODUODENOSCOPY (EGD) WITH PROPOFOL N/A 12/31/2018   Dr. Darrick Penna: Benign-appearing esophageal stricture, nonbleeding duodenal diverticulum, mild gastritis/duodenitis.  Esophagus dilated.   Gastric biopsy benign, no H. pylori.   HERNIA REPAIR     INGUINAL HERNIA REPAIR  11/08/2011   Procedure: HERNIA REPAIR INGUINAL ADULT;  Surgeon: Emelia Loron, MD;  Location: [facility] ORS;  Service: General;  Laterality: Right;   KNEE SURGERY  2009   right - scope   SAVORY DILATION N/A 12/31/2018   Procedure: SAVORY DILATION;  Surgeon: West Bali, MD;  Location: [facility];  Service: Endoscopy;  Laterality: N/A;   SHOULDER SURGERY  1998   right - scope   TONSILLECTOMY     Patient Active Problem List   Diagnosis Date Noted   Dysphagia 12/18/2018   Cervical spondylosis with radiculopathy 05/24/2018   Encounter for screening colonoscopy 07/11/2012   SHOULDER PAIN 12/05/2007   IMPINGEMENT SYNDROME 12/05/2007   DEGENERATIVE JOINT DISEASE, KNEE 02/22/2007   TEAR MEDIAL MENISCUS 02/22/2007    PCP: Benita Stabile, MD  REFERRING PROVIDER: Micael Hampshire, FNP  REFERRING DIAG: pain in right shoulder  THERAPY DIAG:  Right shoulder pain, unspecified chronicity  Rationale for Evaluation and Treatment: Rehabilitation  ONSET DATE: over a year  SUBJECTIVE:  SUBJECTIVE STATEMENT: Popping and "grabbing" every once in a while in Rt shoulder that still bothers him.  Favors Lt side.  Feels his ROM has improved.  Reports his neck is not feeling good today at all due to arthritis.      Right shoulder pain ongoing for about a year; he has had a couple falls onto the right shoulder his dog pulling him down; pain with reaching across his body and raising his arm.  Get some popping with forearm movement and with elbow flexion and extension; sometimes bothers me with washing my hair; doing my everyday stuff is hard Hand dominance: Right  PERTINENT HISTORY: Right shoulder arthroscopy in [place], [place] in  98 Cervical fusions/surgeries C3-C7; 5 separate surgeries Neuropathy in both feet up to ankles  PAIN:  Are you having pain? Yes: NPRS scale: 3 Pain location: Right shoulder Pain description: pops Aggravating factors: moving it a certain way Relieving factors: ibuprofen, not moving it  PRECAUTIONS: None   WEIGHT BEARING RESTRICTIONS: No  FALLS:  Has patient fallen in last 6 months? No  OCCUPATION: retired  PLOF: Independent  PATIENT GOALS:make my shoulder feel better  NEXT MD VISIT:   OBJECTIVE:  Note: Objective measures were completed at Evaluation unless otherwise noted.  DIAGNOSTIC FINDINGS:  Painful right shoulder   X-rays right shoulder   X-ray shows the humeral head is high riding in the glenoid remaining articular surfaces look normal shaft looks normal acromion looks to be a type I   High riding humeral head suggestive of chronic rotator cuff disease  PATIENT SURVEYS:  Quick Dash 29.5/100 29.5%  COGNITION: Overall cognitive status: Within functional limits for tasks assessed     SENSATION: WFL  POSTURE: Forward head and shoulders  UPPER EXTREMITY ROM:   Active ROM Right eval Left eval Right 07/18/23  Shoulder flexion 142* 144 155  Shoulder extension     Shoulder abduction 99* 150 115  Shoulder adduction     Shoulder internal rotation 82 85 85  Shoulder external rotation 84 55* 80  Elbow flexion     Elbow extension     Wrist flexion     Wrist extension     Wrist ulnar deviation     Wrist radial deviation     Wrist pronation     Wrist supination     (Blank rows = not tested)  UPPER EXTREMITY MMT:  MMT Right eval Left eval  Shoulder flexion 4-* 5  Shoulder extension    Shoulder abduction 4 5  Shoulder adduction    Shoulder internal rotation 4+ 5  Shoulder external rotation 4+ 5  Middle trapezius 4+ 5  Lower trapezius    Elbow flexion 4 5  Elbow extension 4* 5  Wrist flexion    Wrist extension    Wrist ulnar deviation     Wrist radial deviation    Wrist pronation    Wrist supination    Grip strength (lbs)    (Blank rows = not tested) CERVICAL ROM:   Active ROM AROM (deg) eval  Flexion   Extension   Right lateral flexion   Left lateral flexion   Right rotation 32  Left rotation 40   (Blank rows = not tested)   SHOULDER SPECIAL TESTS:  Rotator cuff assessment: Drop arm test: negative and Empty can test: negative   JOINT MOBILITY TESTING:  Noted right shoulder sits forward, more rounded than left  PALPATION:  Tender right upper trap, supraspinatus fossa, infraspinatus muscle belly; proximal shoulder;  origin of pec                                                                                                                             TREATMENT DATE: 07/18/23 UBE 4 minutes level 1 backward  Pulleys 2'flex, 2'abd Rt UE  Flexion AROM with 1# 10X  Bicep curls 1# 10X  Hammer curls 1# 10X   Abduction Rt 10X  Scapular retraction 10X  Cervical retraction 10X Doorway stretch 2X30" Supine PROM and manual therapy to Rt shoulder  07/13/23 UBE 4 minutes level 1 backward  Pulleys 2'flex, 2'abd Rt UE Doorway stretch 2X30" Wall slide flexion and abduction  Supine Rt wand flexion 10X  Rt UE abduction 10X  Rt ER 10X PROM and manual therapy to Rt shoulder  07/07/23 Supine  Manual therapy to right shoulder:  Instrument-assisted soft tissue mobilization/ soft tissue mobilization to right pec, Lats, IR's. PROM of shoulder,  Mobilizations including AP glides to GHJ, thoracic spine Grade 3-4 for ROM Supine stick flexion with 2# 2x10 Seated cervical/thoracic Mobilization with movement/ SNAGS into extension, rotation  UBE retro 1'   07/04/23 Supine  Manual therapy to right shoulder: soft tissue mobilization to right pec, Lats, IR's. PROM of shoulder, instrument-assisted soft tissue mobilization to right pecs, AP glides to GHJ   Supine stick press, shoulder flexion (2#) x10 each  Right shoulder ER  with right theraband x10 Left sideyling  ER with 1# 2x10  Abduction with 1# 2x10 UBE retro 1'     06/27/23 physical therapy evaluation and HEP instruction   PATIENT EDUCATION: Education details: Patient educated on exam findings, POC, scope of PT, HEP, and what to expect next visit. Person educated: Patient Education method: Explanation, Demonstration, and Handouts Education comprehension: verbalized understanding, returned demonstration, verbal cues required, and tactile cues required  HOME EXERCISE PROGRAM: Access Code: [email or web address] URL: [email or web address] Date: 06/27/2023 Prepared by: [facility] - [facility]  Exercises - Supine Scapular Retraction  - 2 x daily - 7 x weekly - 1 sets - 10 reps - 5 sec hold - Corner Pec Major Stretch  - 2 x daily - 7 x weekly - 1 sets - 5 reps - 20 sec hold  ASSESSMENT:  CLINICAL IMPRESSION: Continued with focus on improving Rt shoulder AROM.  Measured in supine today revealing vast improvement.  Began low weight AROM for Rt shoulder  in seated without pain or issues.  Tightness and tenderness continues in Rt periscapular musculature as well as anterior deltoid with palpation during manual therapy.  Encouraged to begin doing light shoulder strengthening at home using cans or very light weights.  No pain at end of session today.    Patient is a 73 y.o. male who was seen today for physical therapy evaluation and treatment for pain in right shoulder. Patient demonstrates decreased strength, ROM restriction, reduced flexibility, increased tenderness to palpation and postural abnormalities which are likely contributing to symptoms of pain and are negatively impacting patient  ability to perform ADLs. Patient will benefit from skilled physical therapy services to address these deficits to reduce pain and improve level of function with ADLs   OBJECTIVE IMPAIRMENTS: decreased activity tolerance, decreased mobility, decreased ROM, decreased strength,  increased fascial restrictions, impaired perceived functional ability, and pain.   ACTIVITY LIMITATIONS: carrying, lifting, reach over head, hygiene/grooming, and caring for others  PARTICIPATION LIMITATIONS: meal prep, cleaning, laundry, and yard work   [facility] POTENTIAL: Good  CLINICAL DECISION MAKING: Stable/uncomplicated  EVALUATION COMPLEXITY: Low   GOALS: Goals reviewed with patient? No  SHORT TERM GOALS: Target date: 07/11/2023  patient will be independent with initial HEP  Baseline: Goal status: INITIAL  2.  Patient will self report 30% improvement to improve tolerance for functional activity  Baseline:  Goal status: INITIAL  LONG TERM GOALS: Target date: 07/25/2023  Patient will be independent in self management strategies to improve quality of life and functional outcomes.  Baseline:  Goal status: INITIAL  2.  Patient will self report 50% improvement to improve tolerance for functional activity  Baseline:  Goal status: INITIAL  3.  Patient will increase right shoulder elevation by 20 degrees to improve ability to reach overhead to wash his head Baseline: see above Goal status: INITIAL  4.  Patient will improve her QDASH score to 20% or less to demonstrate improved perceived functional mobility of the right shoulder Baseline: 29.5% Goal status: INITIAL   PLAN:  PT FREQUENCY: 2x/week  PT DURATION: 4 weeks  PLANNED INTERVENTIONS: 97164- PT Re-evaluation, 97110-Therapeutic exercises, 97530- Therapeutic activity, 97112- Neuromuscular re-education, 97535- Self Care, 16109- Manual therapy, [phone]- Gait training, [phone]- Orthotic Fit/training, [phone]- Canalith repositioning, [id number]- Aquatic Therapy, [phone]- Splinting, Patient/Family education, Balance training, Stair training, Taping, Dry Needling, Joint mobilization, Joint manipulation, Spinal manipulation, Spinal mobilization, Scar mobilization, and DME instructions.   PLAN FOR NEXT SESSION: continue to improve  Rt shoulder mobility and function.  Increase strength. Re-evaluate X 2 more sessions.  Lurena Nida, PTA/CLT [facility] Ph: [phone] 2:31 PM, 07/18/23

## 2023-07-20 ENCOUNTER — Ambulatory Visit (HOSPITAL_COMMUNITY): Payer: Medicare Other

## 2023-07-20 ENCOUNTER — Encounter (HOSPITAL_COMMUNITY): Payer: Self-pay

## 2023-07-20 DIAGNOSIS — M25511 Pain in right shoulder: Secondary | ICD-10-CM | POA: Diagnosis not present

## 2023-07-20 NOTE — Therapy (Signed)
OUTPATIENT PHYSICAL THERAPY TREATMENT   Patient Name: [name] MRN: [id number] DOB:04-Dec-1950, 73 y.o., male Today's Date: 07/20/2023  END OF SESSION:  PT End of Session - 07/20/23 1341     Visit Number 6    Number of Visits 8    Date for PT Re-Evaluation 07/25/23    Authorization Type BCBS    PT Start Time 1343    PT Stop Time 1428    PT Time Calculation (min) 45 min    Activity Tolerance Patient tolerated treatment well    Behavior During Therapy WFL for tasks assessed/performed               Past Medical History:  Diagnosis Date   Anxiety    Arthritis    BPH (benign prostatic hyperplasia)    Cancer (HCC)    SKIN   Chronic neck pain    narcotics, work injury in the 1990s, s/p multiple procedures   Constipation    Difficulty sleeping    GERD (gastroesophageal reflux disease)    not anymore   H/O jaundice    AS CHILD   Hypertension    Nasal congestion    Palpitations    "every once in a while"   PONV (postoperative nausea and vomiting)    Past Surgical History:  Procedure Laterality Date   ANTERIOR CERVICAL DECOMP/DISCECTOMY FUSION N/A 05/24/2018   Procedure: ANTERIOR CERVICAL DECOMPRESSION/DISCECTOMY FUSION, INTERBODY PROSTHESIS, PLATE/SCREWS CERVICAL THREE- CERVICAL FOUR; EXPLORE FUSION;  Surgeon: Tressie Stalker, MD;  Location: [facility] OR;  Service: Neurosurgery;  Laterality: N/A;   BIOPSY  12/31/2018   Procedure: BIOPSY;  Surgeon: West Bali, MD;  Location: [facility];  Service: Endoscopy;;   CERVICAL FUSION  1996, 1998, 2010   C6-C7, C5-C6, C4-C5   COLONOSCOPY WITH PROPOFOL N/A 07/24/2012   Dr. Darrick Penna: 5 colon polyps removed, one simple adenoma the other is hyperplastic, mild diverticulosis, small internal hemorrhoids.  Next colonoscopy 10 years   ESOPHAGOGASTRODUODENOSCOPY (EGD) WITH PROPOFOL N/A 12/31/2018   Dr. Darrick Penna: Benign-appearing esophageal stricture, nonbleeding duodenal diverticulum, mild gastritis/duodenitis.  Esophagus dilated.   Gastric biopsy benign, no H. pylori.   HERNIA REPAIR     INGUINAL HERNIA REPAIR  11/08/2011   Procedure: HERNIA REPAIR INGUINAL ADULT;  Surgeon: Emelia Loron, MD;  Location: [facility] ORS;  Service: General;  Laterality: Right;   KNEE SURGERY  2009   right - scope   SAVORY DILATION N/A 12/31/2018   Procedure: SAVORY DILATION;  Surgeon: West Bali, MD;  Location: [facility];  Service: Endoscopy;  Laterality: N/A;   SHOULDER SURGERY  1998   right - scope   TONSILLECTOMY     Patient Active Problem List   Diagnosis Date Noted   Dysphagia 12/18/2018   Cervical spondylosis with radiculopathy 05/24/2018   Encounter for screening colonoscopy 07/11/2012   SHOULDER PAIN 12/05/2007   IMPINGEMENT SYNDROME 12/05/2007   DEGENERATIVE JOINT DISEASE, KNEE 02/22/2007   TEAR MEDIAL MENISCUS 02/22/2007    PCP: Benita Stabile, MD  REFERRING PROVIDER: Micael Hampshire, FNP  REFERRING DIAG: pain in right shoulder  THERAPY DIAG:  Right shoulder pain, unspecified chronicity  Rationale for Evaluation and Treatment: Rehabilitation  ONSET DATE: over a year  SUBJECTIVE:  SUBJECTIVE STATEMENT: Rt shoulder continues to pop anterion shoulder with supination and pronotation, pain scale 3-4/10 today.     Right shoulder pain ongoing for about a year; he has had a couple falls onto the right shoulder his dog pulling him down; pain with reaching across his body and raising his arm.  Get some popping with forearm movement and with elbow flexion and extension; sometimes bothers me with washing my hair; doing my everyday stuff is hard Hand dominance: Right  PERTINENT HISTORY: Right shoulder arthroscopy in [place], [place] in 98 Cervical fusions/surgeries C3-C7; 5 separate surgeries Neuropathy in both feet up to ankles  PAIN:   Are you having pain? Yes: NPRS scale: 3 Pain location: Right shoulder Pain description: pops Aggravating factors: moving it a certain way Relieving factors: ibuprofen, not moving it  PRECAUTIONS: None   WEIGHT BEARING RESTRICTIONS: No  FALLS:  Has patient fallen in last 6 months? No  OCCUPATION: retired  PLOF: Independent  PATIENT GOALS:make my shoulder feel better  NEXT MD VISIT:   OBJECTIVE:  Note: Objective measures were completed at Evaluation unless otherwise noted.  DIAGNOSTIC FINDINGS:  Painful right shoulder   X-rays right shoulder   X-ray shows the humeral head is high riding in the glenoid remaining articular surfaces look normal shaft looks normal acromion looks to be a type I   High riding humeral head suggestive of chronic rotator cuff disease  PATIENT SURVEYS:  Quick Dash 29.5/100 29.5%  COGNITION: Overall cognitive status: Within functional limits for tasks assessed     SENSATION: WFL  POSTURE: Forward head and shoulders  UPPER EXTREMITY ROM:   Active ROM Right eval Left eval Right 07/18/23  Shoulder flexion 142* 144 155  Shoulder extension     Shoulder abduction 99* 150 115  Shoulder adduction     Shoulder internal rotation 82 85 85  Shoulder external rotation 84 55* 80  Elbow flexion     Elbow extension     Wrist flexion     Wrist extension     Wrist ulnar deviation     Wrist radial deviation     Wrist pronation     Wrist supination     (Blank rows = not tested)  UPPER EXTREMITY MMT:  MMT Right eval Left eval  Shoulder flexion 4-* 5  Shoulder extension    Shoulder abduction 4 5  Shoulder adduction    Shoulder internal rotation 4+ 5  Shoulder external rotation 4+ 5  Middle trapezius 4+ 5  Lower trapezius    Elbow flexion 4 5  Elbow extension 4* 5  Wrist flexion    Wrist extension    Wrist ulnar deviation    Wrist radial deviation    Wrist pronation    Wrist supination    Grip strength (lbs)    (Blank rows =  not tested) CERVICAL ROM:   Active ROM AROM (deg) eval  Flexion   Extension   Right lateral flexion   Left lateral flexion   Right rotation 32  Left rotation 40   (Blank rows = not tested)   SHOULDER SPECIAL TESTS:  Rotator cuff assessment: Drop arm test: negative and Empty can test: negative   JOINT MOBILITY TESTING:  Noted right shoulder sits forward, more rounded than left  PALPATION:  Tender right upper trap, supraspinatus fossa, infraspinatus muscle belly; proximal shoulder; origin of pec  TREATMENT DATE: 07/20/23: UBE 4 min L1 backward Wall slide 10x 5" good tolerance with flexion, painful with abduction so DC'd Seated RTB rows 20x 5"  YTB ER 20x Standing rows and ER- RTB given as HEP AROM abduction 120 degrees Pronate/supinate 2# Hammer curl 3# 10x Bicep curls 3# STM to Rt shoulder, PROM  07/18/23 UBE 4 minutes level 1 backward  Pulleys 2'flex, 2'abd Rt UE  Flexion AROM with 1# 10X  Bicep curls 1# 10X  Hammer curls 1# 10X   Abduction Rt 10X  Scapular retraction 10X  Cervical retraction 10X Doorway stretch 2X30" Supine PROM and manual therapy to Rt shoulder  07/13/23 UBE 4 minutes level 1 backward  Pulleys 2'flex, 2'abd Rt UE Doorway stretch 2X30" Wall slide flexion and abduction  Supine Rt wand flexion 10X  Rt UE abduction 10X  Rt ER 10X PROM and manual therapy to Rt shoulder  07/07/23 Supine  Manual therapy to right shoulder:  Instrument-assisted soft tissue mobilization/ soft tissue mobilization to right pec, Lats, IR's. PROM of shoulder,  Mobilizations including AP glides to GHJ, thoracic spine Grade 3-4 for ROM Supine stick flexion with 2# 2x10 Seated cervical/thoracic Mobilization with movement/ SNAGS into extension, rotation  UBE retro 1'   07/04/23 Supine  Manual therapy to right shoulder: soft tissue mobilization  to right pec, Lats, IR's. PROM of shoulder, instrument-assisted soft tissue mobilization to right pecs, AP glides to GHJ   Supine stick press, shoulder flexion (2#) x10 each  Right shoulder ER with right theraband x10 Left sideyling  ER with 1# 2x10  Abduction with 1# 2x10 UBE retro 1'     06/27/23 physical therapy evaluation and HEP instruction   PATIENT EDUCATION: Education details: Patient educated on exam findings, POC, scope of PT, HEP, and what to expect next visit. Person educated: Patient Education method: Explanation, Demonstration, and Handouts Education comprehension: verbalized understanding, returned demonstration, verbal cues required, and tactile cues required  HOME EXERCISE PROGRAM: Access Code: [email or web address] URL: [email or web address] Date: 06/27/2023 Prepared by: [facility] - Rehab  Exercises - Supine Scapular Retraction  - 2 x daily - 7 x weekly - 1 sets - 10 reps - 5 sec hold - Corner Pec Major Stretch  - 2 x daily - 7 x weekly - 1 sets - 5 reps - 20 sec hold  ASSESSMENT:  CLINICAL IMPRESSION: Session focus with Rt shoulder AROM and strengthening.  Added wall slides for mobility and theraband resistance for strengthening.  Manual STM complete proximal Rt shoulder to address soft tissue restrictions and PROM pain free range.  Pt most limited with abduction movements above 100 degrees, able to achieve 120 AROM but painful and difficult to achieve.  During session pt able to complete pronation/subination with no popping, stated "it comes and goes."  Pt given RTB and HEP for rows and ER for strengthening.     Patient is a 73 y.o. male who was seen today for physical therapy evaluation and treatment for pain in right shoulder. Patient demonstrates decreased strength, ROM restriction, reduced flexibility, increased tenderness to palpation and postural abnormalities which are likely contributing to symptoms of pain and are negatively impacting patient ability to  perform ADLs. Patient will benefit from skilled physical therapy services to address these deficits to reduce pain and improve level of function with ADLs   OBJECTIVE IMPAIRMENTS: decreased activity tolerance, decreased mobility, decreased ROM, decreased strength, increased fascial restrictions, impaired perceived functional ability, and pain.   ACTIVITY LIMITATIONS: carrying, lifting, reach over head, hygiene/grooming, and  caring for others  PARTICIPATION LIMITATIONS: meal prep, cleaning, laundry, and yard work   [facility] POTENTIAL: Good  CLINICAL DECISION MAKING: Stable/uncomplicated  EVALUATION COMPLEXITY: Low   GOALS: Goals reviewed with patient? No  SHORT TERM GOALS: Target date: 07/11/2023  patient will be independent with initial HEP  Baseline: Goal status: INITIAL  2.  Patient will self report 30% improvement to improve tolerance for functional activity  Baseline:  Goal status: INITIAL  LONG TERM GOALS: Target date: 07/25/2023  Patient will be independent in self management strategies to improve quality of life and functional outcomes.  Baseline:  Goal status: INITIAL  2.  Patient will self report 50% improvement to improve tolerance for functional activity  Baseline:  Goal status: INITIAL  3.  Patient will increase right shoulder elevation by 20 degrees to improve ability to reach overhead to wash his head Baseline: see above Goal status: INITIAL  4.  Patient will improve her QDASH score to 20% or less to demonstrate improved perceived functional mobility of the right shoulder Baseline: 29.5% Goal status: INITIAL   PLAN:  PT FREQUENCY: 2x/week  PT DURATION: 4 weeks  PLANNED INTERVENTIONS: 97164- PT Re-evaluation, 97110-Therapeutic exercises, 97530- Therapeutic activity, 97112- Neuromuscular re-education, 97535- Self Care, 16109- Manual therapy, [phone]- Gait training, [phone]- Orthotic Fit/training, [phone]- Canalith repositioning, [id number]- Aquatic Therapy,  [phone]- Splinting, Patient/Family education, Balance training, Stair training, Taping, Dry Needling, Joint mobilization, Joint manipulation, Spinal manipulation, Spinal mobilization, Scar mobilization, and DME instructions.   PLAN FOR NEXT SESSION: continue to improve Rt shoulder mobility and function.  Increase strength. Re-evaluate next session,  Becky Sax, LPTA/CLT; CBIS [phone]  4:25 PM, 07/20/23

## 2023-07-25 ENCOUNTER — Ambulatory Visit (HOSPITAL_COMMUNITY): Payer: Medicare Other

## 2023-07-25 DIAGNOSIS — M25511 Pain in right shoulder: Secondary | ICD-10-CM

## 2023-07-25 NOTE — Therapy (Signed)
OUTPATIENT PHYSICAL THERAPY DISCHARGE PHYSICAL THERAPY DISCHARGE SUMMARY  Visits from Start of Care: 7  Current functional level related to goals / functional outcomes: See below   Remaining deficits: See below   Education / Equipment: HEP   Patient agrees to discharge. Patient goals were met. Patient is being discharged due to being pleased with the current functional level.    Patient Name: [name] MRN: [id number] DOB:1950-11-04, 73 y.o., male Today's Date: 07/25/2023  END OF SESSION:  PT End of Session - 07/25/23 1429     Visit Number 7    Number of Visits 8    Date for PT Re-Evaluation 07/25/23    Authorization Type BC[employer]    PT Start Time 1426    PT Stop Time 1505    PT Time Calculation (min) 39 min    Activity Tolerance Patient tolerated treatment well    Behavior During Therapy WFL for tasks assessed/performed               Past Medical History:  Diagnosis Date   Anxiety    Arthritis    BPH (benign prostatic hyperplasia)    Cancer (HCC)    SKIN   Chronic neck pain    narcotics, work injury in the 1990s, s/p multiple procedures   Constipation    Difficulty sleeping    GERD (gastroesophageal reflux disease)    not anymore   H/O jaundice    AS CHILD   Hypertension    Nasal congestion    Palpitations    "every once in a while"   PONV (postoperative nausea and vomiting)    Past Surgical History:  Procedure Laterality Date   ANTERIOR CERVICAL DECOMP/DISCECTOMY FUSION N/A 05/24/2018   Procedure: ANTERIOR CERVICAL DECOMPRESSION/DISCECTOMY FUSION, INTERBODY PROSTHESIS, PLATE/SCREWS CERVICAL THREE- CERVICAL FOUR; EXPLORE FUSION;  Surgeon: Tressie Stalker, MD;  Location: [facility] OR;  Service: Neurosurgery;  Laterality: N/A;   BIOPSY  12/31/2018   Procedure: BIOPSY;  Surgeon: West Bali, MD;  Location: [facility];  Service: Endoscopy;;   CERVICAL FUSION  1996, 1998, 2010   C6-C7, C5-C6, C4-C5   COLONOSCOPY WITH PROPOFOL N/A 07/24/2012   Dr.  Darrick Penna: 5 colon polyps removed, one simple adenoma the other is hyperplastic, mild diverticulosis, small internal hemorrhoids.  Next colonoscopy 10 years   ESOPHAGOGASTRODUODENOSCOPY (EGD) WITH PROPOFOL N/A 12/31/2018   Dr. Darrick Penna: Benign-appearing esophageal stricture, nonbleeding duodenal diverticulum, mild gastritis/duodenitis.  Esophagus dilated.  Gastric biopsy benign, no H. pylori.   HERNIA REPAIR     INGUINAL HERNIA REPAIR  11/08/2011   Procedure: HERNIA REPAIR INGUINAL ADULT;  Surgeon: Emelia Loron, MD;  Location: [facility];  Service: General;  Laterality: Right;   KNEE SURGERY  2009   right - scope   SAVORY DILATION N/A 12/31/2018   Procedure: SAVORY DILATION;  Surgeon: West Bali, MD;  Location: [facility];  Service: Endoscopy;  Laterality: N/A;   SHOULDER SURGERY  1998   right - scope   TONSILLECTOMY     Patient Active Problem List   Diagnosis Date Noted   Dysphagia 12/18/2018   Cervical spondylosis with radiculopathy 05/24/2018   Encounter for screening colonoscopy 07/11/2012   SHOULDER PAIN 12/05/2007   IMPINGEMENT SYNDROME 12/05/2007   DEGENERATIVE JOINT DISEASE, KNEE 02/22/2007   TEAR MEDIAL MENISCUS 02/22/2007    PCP: Benita Stabile, MD  REFERRING PROVIDER: Micael Hampshire, FNP  REFERRING DIAG: pain in right shoulder  THERAPY DIAG:  Right shoulder pain, unspecified chronicity  Rationale for  Evaluation and Treatment: Rehabilitation  ONSET DATE: over a year  SUBJECTIVE:                                                                                                                                                                                      SUBJECTIVE STATEMENT: "30 to 40%" better overall.  Shoulder seems to be moving better and increased in strength noted but still has some pain and popping occasionally.   Right shoulder pain ongoing for about a year; he has had a couple falls onto the right shoulder his dog pulling him down; pain with reaching  across his body and raising his arm.  Get some popping with forearm movement and with elbow flexion and extension; sometimes bothers me with washing my hair; doing my everyday stuff is hard Hand dominance: Right  PERTINENT HISTORY: Right shoulder arthroscopy in [place], [place] in 98 Cervical fusions/surgeries C3-C7; 5 separate surgeries Neuropathy in both feet up to ankles  PAIN:  Are you having pain? Yes: NPRS scale: 3 Pain location: Right shoulder Pain description: pops Aggravating factors: moving it a certain way Relieving factors: ibuprofen, not moving it  PRECAUTIONS: None   WEIGHT BEARING RESTRICTIONS: No  FALLS:  Has patient fallen in last 6 months? No  OCCUPATION: retired  PLOF: Independent  PATIENT GOALS:make my shoulder feel better  NEXT MD VISIT:   OBJECTIVE:  Note: Objective measures were completed at Evaluation unless otherwise noted.  DIAGNOSTIC FINDINGS:  Painful right shoulder   X-rays right shoulder   X-ray shows the humeral head is high riding in the glenoid remaining articular surfaces look normal shaft looks normal acromion looks to be a type I   High riding humeral head suggestive of chronic rotator cuff disease  PATIENT SURVEYS:  Quick Dash 29.5/100 29.5%  COGNITION: Overall cognitive status: Within functional limits for tasks assessed     SENSATION: WFL  POSTURE: Forward head and shoulders  UPPER EXTREMITY ROM:   Active ROM Right eval Left eval Right 07/18/23 Right 07/25/23  Shoulder flexion 142* 144 155 156  Shoulder extension      Shoulder abduction 99* 150 115 135  Shoulder adduction      Shoulder internal rotation 82 85 85   Shoulder external rotation 55* 84 80 90  Elbow flexion      Elbow extension      Wrist flexion      Wrist extension      Wrist ulnar deviation      Wrist radial deviation      Wrist pronation      Wrist supination      (Blank rows = not tested)  UPPER  EXTREMITY MMT:  MMT Right eval  Left eval Right 07/25/23  Shoulder flexion 4-* 5 4+  Shoulder extension     Shoulder abduction 4 5 5   Shoulder adduction     Shoulder internal rotation 4+ 5 5  Shoulder external rotation 4+ 5 5  Middle trapezius 4+ 5 5  Lower trapezius     Elbow flexion 4 5 5   Elbow extension 4* 5 5  Wrist flexion     Wrist extension     Wrist ulnar deviation     Wrist radial deviation     Wrist pronation     Wrist supination     Grip strength (lbs)     (Blank rows = not tested) CERVICAL ROM:   Active ROM AROM (deg) eval AROM 07/25/23  Flexion    Extension    Right lateral flexion    Left lateral flexion    Right rotation 32 40  Left rotation 40 40   (Blank rows = not tested)   SHOULDER SPECIAL TESTS:  Rotator cuff assessment: Drop arm test: negative and Empty can test: negative   JOINT MOBILITY TESTING:  Noted right shoulder sits forward, more rounded than left  PALPATION:  Tender right upper trap, supraspinatus fossa, infraspinatus muscle belly; proximal shoulder; origin of pec                                                                                                                             TREATMENT DATE: 07/25/23 Progress note UBE 4 min backwards QDASH 9.1% MMTs and ROM see above Review of goals  07/20/23: UBE 4 min L1 backward Wall slide 10x 5" good tolerance with flexion, painful with abduction so DC'd Seated RTB rows 20x 5"  YTB ER 20x Standing rows and ER- RTB given as HEP AROM abduction 120 degrees Pronate/supinate 2# Hammer curl 3# 10x Bicep curls 3# STM to Rt shoulder, PROM  07/18/23 UBE 4 minutes level 1 backward  Pulleys 2'flex, 2'abd Rt UE  Flexion AROM with 1# 10X  Bicep curls 1# 10X  Hammer curls 1# 10X   Abduction Rt 10X  Scapular retraction 10X  Cervical retraction 10X Doorway stretch 2X30" Supine PROM and manual therapy to Rt shoulder  07/13/23 UBE 4 minutes level 1 backward  Pulleys 2'flex, 2'abd Rt UE Doorway stretch 2X30" Wall  slide flexion and abduction  Supine Rt wand flexion 10X  Rt UE abduction 10X  Rt ER 10X PROM and manual therapy to Rt shoulder  07/07/23 Supine  Manual therapy to right shoulder:  Instrument-assisted soft tissue mobilization/ soft tissue mobilization to right pec, Lats, IR's. PROM of shoulder,  Mobilizations including AP glides to GHJ, thoracic spine Grade 3-4 for ROM Supine stick flexion with 2# 2x10 Seated cervical/thoracic Mobilization with movement/ SNAGS into extension, rotation  UBE retro 1'   07/04/23 Supine  Manual therapy to right shoulder: soft tissue mobilization to right pec, Lats, IR's. PROM of shoulder, instrument-assisted soft tissue  mobilization to right pecs, AP glides to GHJ   Supine stick press, shoulder flexion (2#) x10 each  Right shoulder ER with right theraband x10 Left sideyling  ER with 1# 2x10  Abduction with 1# 2x10 UBE retro 1'     06/27/23 physical therapy evaluation and HEP instruction   PATIENT EDUCATION: Education details: Patient educated on exam findings, POC, scope of PT, HEP, and what to expect next visit. Person educated: Patient Education method: Explanation, Demonstration, and Handouts Education comprehension: verbalized understanding, returned demonstration, verbal cues required, and tactile cues required  HOME EXERCISE PROGRAM: Access Code: [email or web address] URL: [email or web address] Date: 06/27/2023 Prepared by: [facility]  Exercises - Supine Scapular Retraction  - 2 x daily - 7 x weekly - 1 sets - 10 reps - 5 sec hold - Corner Pec Major Stretch  - 2 x daily - 7 x weekly - 1 sets - 5 reps - 20 sec hold  ASSESSMENT:  CLINICAL IMPRESSION: Progress note; patient has met 5/6 rehab goals and is agreeable to discharge at this time.   Patient is a 73 y.o. male who was seen today for physical therapy evaluation and treatment for pain in right shoulder. Patient demonstrates decreased strength, ROM restriction, reduced  flexibility, increased tenderness to palpation and postural abnormalities which are likely contributing to symptoms of pain and are negatively impacting patient ability to perform ADLs. Patient will benefit from skilled physical therapy services to address these deficits to reduce pain and improve level of function with ADLs   OBJECTIVE IMPAIRMENTS: decreased activity tolerance, decreased mobility, decreased ROM, decreased strength, increased fascial restrictions, impaired perceived functional ability, and pain.   ACTIVITY LIMITATIONS: carrying, lifting, reach over head, hygiene/grooming, and caring for others  PARTICIPATION LIMITATIONS: meal prep, cleaning, laundry, and yard work   [facility] POTENTIAL: Good  CLINICAL DECISION MAKING: Stable/uncomplicated  EVALUATION COMPLEXITY: Low   GOALS: Goals reviewed with patient? No  SHORT TERM GOALS: Target date: 07/11/2023  patient will be independent with initial HEP  Baseline: Goal status: met  2.  Patient will self report 30% improvement to improve tolerance for functional activity  Baseline:  Goal status: met  LONG TERM GOALS: Target date: 07/25/2023  Patient will be independent in self management strategies to improve quality of life and functional outcomes.  Baseline:  Goal status: met  2.  Patient will self report 50% improvement to improve tolerance for functional activity  Baseline:  Goal status: INITIAL  3.  Patient will increase right shoulder elevation by 20 degrees to improve ability to reach overhead to wash his head Baseline: see above Goal status: met  4.  Patient will improve her QDASH score to 20% or less to demonstrate improved perceived functional mobility of the right shoulder Baseline: 29.5% Goal status:met   PLAN:  PT FREQUENCY: 2x/week  PT DURATION: 4 weeks  PLANNED INTERVENTIONS: 97164- PT Re-evaluation, 97110-Therapeutic exercises, 97530- Therapeutic activity, 97112- Neuromuscular re-education,  97535- Self Care, 54098- Manual therapy, [phone]- Gait training, [phone]- Orthotic Fit/training, [phone]- Canalith repositioning, [id number]- Aquatic Therapy, [phone]- Splinting, Patient/Family education, Balance training, Stair training, Taping, Dry Needling, Joint mobilization, Joint manipulation, Spinal manipulation, Spinal mobilization, Scar mobilization, and DME instructions.   PLAN FOR NEXT SESSION: discharge  3:07 PM, 07/25/23 Loc Feinstein Small Xayne Brumbaugh MPT [facility] [place] [phone]

## 2023-07-27 ENCOUNTER — Encounter (HOSPITAL_COMMUNITY): Payer: Medicare Other

## 2023-08-11 ENCOUNTER — Encounter (HOSPITAL_COMMUNITY): Payer: Medicare Other

## 2023-12-13 DIAGNOSIS — Z125 Encounter for screening for malignant neoplasm of prostate: Secondary | ICD-10-CM | POA: Diagnosis not present

## 2023-12-13 DIAGNOSIS — I10 Essential (primary) hypertension: Secondary | ICD-10-CM | POA: Diagnosis not present

## 2023-12-22 DIAGNOSIS — I10 Essential (primary) hypertension: Secondary | ICD-10-CM | POA: Diagnosis not present

## 2023-12-22 DIAGNOSIS — R112 Nausea with vomiting, unspecified: Secondary | ICD-10-CM | POA: Diagnosis not present

## 2023-12-22 DIAGNOSIS — R197 Diarrhea, unspecified: Secondary | ICD-10-CM | POA: Diagnosis not present

## 2024-02-01 DIAGNOSIS — N401 Enlarged prostate with lower urinary tract symptoms: Secondary | ICD-10-CM | POA: Diagnosis not present

## 2024-02-01 DIAGNOSIS — N5201 Erectile dysfunction due to arterial insufficiency: Secondary | ICD-10-CM | POA: Diagnosis not present

## 2024-02-01 DIAGNOSIS — R972 Elevated prostate specific antigen [PSA]: Secondary | ICD-10-CM | POA: Diagnosis not present

## 2024-02-01 DIAGNOSIS — R350 Frequency of micturition: Secondary | ICD-10-CM | POA: Diagnosis not present

## 2024-02-15 DIAGNOSIS — L821 Other seborrheic keratosis: Secondary | ICD-10-CM | POA: Diagnosis not present

## 2024-04-02 DIAGNOSIS — H00012 Hordeolum externum right lower eyelid: Secondary | ICD-10-CM | POA: Diagnosis not present

## 2024-04-02 DIAGNOSIS — R7301 Impaired fasting glucose: Secondary | ICD-10-CM | POA: Diagnosis not present

## 2024-04-22 DIAGNOSIS — R251 Tremor, unspecified: Secondary | ICD-10-CM | POA: Diagnosis not present

## 2024-04-22 DIAGNOSIS — N401 Enlarged prostate with lower urinary tract symptoms: Secondary | ICD-10-CM | POA: Diagnosis not present

## 2024-04-22 DIAGNOSIS — F5104 Psychophysiologic insomnia: Secondary | ICD-10-CM | POA: Diagnosis not present

## 2024-04-23 DIAGNOSIS — M1711 Unilateral primary osteoarthritis, right knee: Secondary | ICD-10-CM | POA: Diagnosis not present

## 2024-04-30 DIAGNOSIS — K080 Exfoliation of teeth due to systemic causes: Secondary | ICD-10-CM | POA: Diagnosis not present

## 2024-05-08 DIAGNOSIS — N401 Enlarged prostate with lower urinary tract symptoms: Secondary | ICD-10-CM | POA: Diagnosis not present

## 2024-05-08 DIAGNOSIS — R251 Tremor, unspecified: Secondary | ICD-10-CM | POA: Diagnosis not present

## 2024-05-08 DIAGNOSIS — I10 Essential (primary) hypertension: Secondary | ICD-10-CM | POA: Diagnosis not present

## 2024-05-08 DIAGNOSIS — F329 Major depressive disorder, single episode, unspecified: Secondary | ICD-10-CM | POA: Diagnosis not present

## 2024-05-09 DIAGNOSIS — N401 Enlarged prostate with lower urinary tract symptoms: Secondary | ICD-10-CM | POA: Diagnosis not present

## 2024-05-09 DIAGNOSIS — R972 Elevated prostate specific antigen [PSA]: Secondary | ICD-10-CM | POA: Diagnosis not present

## 2024-05-09 DIAGNOSIS — R350 Frequency of micturition: Secondary | ICD-10-CM | POA: Diagnosis not present

## 2024-05-09 DIAGNOSIS — R351 Nocturia: Secondary | ICD-10-CM | POA: Diagnosis not present

## 2024-05-20 DIAGNOSIS — K080 Exfoliation of teeth due to systemic causes: Secondary | ICD-10-CM | POA: Diagnosis not present

## 2024-05-23 ENCOUNTER — Other Ambulatory Visit: Payer: Self-pay

## 2024-05-24 ENCOUNTER — Other Ambulatory Visit: Payer: Self-pay | Admitting: Urology

## 2024-05-24 DIAGNOSIS — N401 Enlarged prostate with lower urinary tract symptoms: Secondary | ICD-10-CM

## 2024-06-27 ENCOUNTER — Ambulatory Visit (HOSPITAL_COMMUNITY)
